# Patient Record
Sex: Female | Born: 1960 | Race: Black or African American | Hispanic: No | Marital: Single | Smoking: Former smoker
Health system: Southern US, Community
[De-identification: ages and names within clinical notes are randomized; demographics above are authoritative.]

## PROBLEM LIST (undated history)

## (undated) DIAGNOSIS — J309 Allergic rhinitis, unspecified: Secondary | ICD-10-CM

## (undated) DIAGNOSIS — Z973 Presence of spectacles and contact lenses: Secondary | ICD-10-CM

## (undated) DIAGNOSIS — N952 Postmenopausal atrophic vaginitis: Secondary | ICD-10-CM

## (undated) DIAGNOSIS — K5900 Constipation, unspecified: Secondary | ICD-10-CM

## (undated) DIAGNOSIS — E559 Vitamin D deficiency, unspecified: Secondary | ICD-10-CM

## (undated) DIAGNOSIS — H81399 Other peripheral vertigo, unspecified ear: Secondary | ICD-10-CM

## (undated) DIAGNOSIS — Z780 Asymptomatic menopausal state: Secondary | ICD-10-CM

## (undated) DIAGNOSIS — J302 Other seasonal allergic rhinitis: Secondary | ICD-10-CM

## (undated) DIAGNOSIS — Z9289 Personal history of other medical treatment: Secondary | ICD-10-CM

## (undated) HISTORY — DX: Other seasonal allergic rhinitis: J30.2

## (undated) HISTORY — PX: APPENDECTOMY: SHX54

## (undated) HISTORY — DX: Constipation, unspecified: K59.00

## (undated) HISTORY — DX: Postmenopausal atrophic vaginitis: N95.2

## (undated) HISTORY — DX: Asymptomatic menopausal state: Z78.0

## (undated) HISTORY — DX: Vitamin D deficiency, unspecified: E55.9

## (undated) HISTORY — DX: Allergic rhinitis, unspecified: J30.9

## (undated) HISTORY — DX: Presence of spectacles and contact lenses: Z97.3

## (undated) HISTORY — PX: EXCISIONAL HEMORRHOIDECTOMY: SHX1541

## (undated) HISTORY — DX: Other peripheral vertigo, unspecified ear: H81.399

## (undated) HISTORY — DX: Personal history of other medical treatment: Z92.89

---

## 1979-03-20 HISTORY — PX: BREAST CYST EXCISION: SHX579

## 1997-10-19 ENCOUNTER — Other Ambulatory Visit: Admission: RE | Admit: 1997-10-19 | Discharge: 1997-10-19 | Payer: Self-pay | Admitting: Obstetrics & Gynecology

## 1999-07-18 ENCOUNTER — Encounter: Payer: Self-pay | Admitting: Family Medicine

## 1999-07-18 ENCOUNTER — Ambulatory Visit (HOSPITAL_COMMUNITY): Admission: RE | Admit: 1999-07-18 | Discharge: 1999-07-18 | Payer: Self-pay | Admitting: Family Medicine

## 1999-12-01 ENCOUNTER — Other Ambulatory Visit: Admission: RE | Admit: 1999-12-01 | Discharge: 1999-12-01 | Payer: Self-pay | Admitting: Family Medicine

## 2001-04-17 ENCOUNTER — Other Ambulatory Visit: Admission: RE | Admit: 2001-04-17 | Discharge: 2001-04-17 | Payer: Self-pay | Admitting: Family Medicine

## 2005-03-19 DIAGNOSIS — Z9289 Personal history of other medical treatment: Secondary | ICD-10-CM

## 2005-03-19 HISTORY — DX: Personal history of other medical treatment: Z92.89

## 2005-04-10 ENCOUNTER — Ambulatory Visit: Payer: Self-pay | Admitting: Gastroenterology

## 2005-04-27 ENCOUNTER — Ambulatory Visit: Payer: Self-pay | Admitting: Gastroenterology

## 2005-05-31 ENCOUNTER — Other Ambulatory Visit: Admission: RE | Admit: 2005-05-31 | Discharge: 2005-05-31 | Payer: Self-pay | Admitting: Family Medicine

## 2005-06-01 ENCOUNTER — Ambulatory Visit (HOSPITAL_COMMUNITY): Admission: RE | Admit: 2005-06-01 | Discharge: 2005-06-01 | Payer: Self-pay | Admitting: Family Medicine

## 2005-06-13 ENCOUNTER — Encounter: Admission: RE | Admit: 2005-06-13 | Discharge: 2005-06-13 | Payer: Self-pay | Admitting: General Surgery

## 2005-09-07 ENCOUNTER — Ambulatory Visit: Payer: Self-pay | Admitting: Family Medicine

## 2006-08-29 ENCOUNTER — Ambulatory Visit: Payer: Self-pay | Admitting: Family Medicine

## 2008-10-20 ENCOUNTER — Ambulatory Visit: Payer: Self-pay | Admitting: Family Medicine

## 2008-10-26 ENCOUNTER — Ambulatory Visit: Payer: Self-pay | Admitting: Family Medicine

## 2010-04-09 ENCOUNTER — Encounter: Payer: Self-pay | Admitting: Family Medicine

## 2011-06-29 ENCOUNTER — Other Ambulatory Visit (HOSPITAL_COMMUNITY): Payer: Self-pay | Admitting: Physician Assistant

## 2011-06-29 ENCOUNTER — Ambulatory Visit (HOSPITAL_COMMUNITY): Payer: Self-pay

## 2011-06-29 DIAGNOSIS — R52 Pain, unspecified: Secondary | ICD-10-CM

## 2011-06-29 DIAGNOSIS — R0602 Shortness of breath: Secondary | ICD-10-CM

## 2011-10-15 ENCOUNTER — Encounter: Payer: Self-pay | Admitting: Medical

## 2011-10-22 ENCOUNTER — Telehealth: Payer: Self-pay | Admitting: Family Medicine

## 2011-10-22 ENCOUNTER — Ambulatory Visit (INDEPENDENT_AMBULATORY_CARE_PROVIDER_SITE_OTHER): Payer: BC Managed Care – PPO | Admitting: Medical

## 2011-10-22 ENCOUNTER — Other Ambulatory Visit (HOSPITAL_COMMUNITY)
Admission: RE | Admit: 2011-10-22 | Discharge: 2011-10-22 | Disposition: A | Discharge status: Home / Self Care | Payer: BC Managed Care – PPO | Source: Ambulatory Visit | Attending: Medical | Admitting: Medical

## 2011-10-22 ENCOUNTER — Encounter: Payer: Self-pay | Admitting: Medical

## 2011-10-22 VITALS — BP 120/80 | HR 68 | Temp 97.9°F | Resp 16 | Ht 59.0 in | Wt 122.0 lb

## 2011-10-22 DIAGNOSIS — Z1239 Encounter for other screening for malignant neoplasm of breast: Secondary | ICD-10-CM

## 2011-10-22 DIAGNOSIS — Z01419 Encounter for gynecological examination (general) (routine) without abnormal findings: Secondary | ICD-10-CM | POA: Insufficient documentation

## 2011-10-22 DIAGNOSIS — Z1151 Encounter for screening for human papillomavirus (HPV): Secondary | ICD-10-CM | POA: Insufficient documentation

## 2011-10-22 DIAGNOSIS — Z124 Encounter for screening for malignant neoplasm of cervix: Secondary | ICD-10-CM

## 2011-10-22 DIAGNOSIS — Z1231 Encounter for screening mammogram for malignant neoplasm of breast: Secondary | ICD-10-CM

## 2011-10-22 DIAGNOSIS — Z0000 Encounter for general adult medical examination without abnormal findings: Secondary | ICD-10-CM

## 2011-10-22 LAB — CBC WITH DIFFERENTIAL/PLATELET
Basophils Absolute: 0.1 10*3/uL (ref 0.0–0.1)
Basophils Relative: 1 % (ref 0–1)
Eosinophils Absolute: 0.3 10*3/uL (ref 0.0–0.7)
Eosinophils Relative: 6 % — ABNORMAL HIGH (ref 0–5)
HCT: 37.9 % (ref 36.0–46.0)
Hemoglobin: 13.1 g/dL (ref 12.0–15.0)
Lymphocytes Relative: 41 % (ref 12–46)
Lymphs Abs: 2.1 10*3/uL (ref 0.7–4.0)
MCH: 30.6 pg (ref 26.0–34.0)
MCHC: 34.6 g/dL (ref 30.0–36.0)
MCV: 88.6 fL (ref 78.0–100.0)
Monocytes Absolute: 0.4 10*3/uL (ref 0.1–1.0)
Monocytes Relative: 8 % (ref 3–12)
Neutro Abs: 2.3 10*3/uL (ref 1.7–7.7)
Neutrophils Relative %: 44 % (ref 43–77)
Platelets: 295 10*3/uL (ref 150–400)
RBC: 4.28 MIL/uL (ref 3.87–5.11)
RDW: 14.2 % (ref 11.5–15.5)
WBC: 5.2 10*3/uL (ref 4.0–10.5)

## 2011-10-22 LAB — POCT URINALYSIS DIPSTICK
Protein, UA: NEGATIVE
Spec Grav, UA: 1.015
Urobilinogen, UA: NEGATIVE
pH, UA: 5

## 2011-10-22 NOTE — Patient Instructions (Signed)

## 2011-10-22 NOTE — Addendum Note (Signed)
Addended by: Janeice Robinson on: 10/22/2011 04:06 PM   Modules accepted: Orders

## 2011-10-22 NOTE — Progress Notes (Signed)
Subjective:   HPI  Dana Li is a 51 y.o. female who presents for a complete physical.  No particular c/o.  Last medical care 2010.   Been doing well.  Wanted to come in for physical.   Preventative care: Last ophthalmology visit: over 1 year ago Last dental visit: over 1 year ago Last tetanus booster: 2009 Flu vaccine: declines  Reviewed their medical, surgical, family, social, medication, and allergy history and updated chart as appropriate.  Past Medical History  Diagnosis Date  . Wears eyeglasses   . H/O mammogram 2007    Past Surgical History  Procedure Date  . Excisional hemorrhoidectomy   . Colonoscopy 04/27/2005    Dr. Christella Hartigan, repeat 2017    Family History  Problem Relation Age of Onset  . Diabetes Mother   . Heart disease Mother 37  . Cancer Mother 31    breast cancer  . Other Father     died of illness  . Asthma Brother     1 died of asthma  . Liver disease Brother   . Stroke Maternal Grandmother     History   Social History  . Marital Status: Married    Spouse Name: N/A    Number of Children: N/A  . Years of Education: N/A   Occupational History  . packaging at [employer]    Social History Main Topics  . Smoking status: Former Smoker -- 0.5 packs/day for 18 years    Types: Cigarettes    Quit date: 03/19/1994  . Smokeless tobacco: Not on file  . Alcohol Use: 0.6 oz/week    1 Glasses of wine per week  . Drug Use: No  . Sexually Active: Not on file   Other Topics Concern  . Not on file   Social History Narrative   Widowed in 03/2008.  No current significant other.  Has 52yo and 52yo, both in [place].  Exercise some.    No current outpatient prescriptions on file prior to visit.    No Known Allergies  Review of Systems Constitutional: -fever, -chills, -sweats, -unexpected weight change, -anorexia, -fatigue Allergy: -sneezing, -itching, -congestion Dermatology: denies changing moles, rash, lumps, new worrisome lesions ENT: -runny  nose, -ear pain, -sore throat, -hoarseness, -sinus pain, -teeth pain, -tinnitus, -hearing loss, -epistaxis Cardiology:  -chest pain, -palpitations, -edema, -orthopnea, -paroxysmal nocturnal dyspnea Respiratory: -cough, -shortness of breath, -dyspnea on exertion, -wheezing, -hemoptysis Gastroenterology: -abdominal pain, -nausea, -vomiting, -diarrhea, -constipation, -blood in stool, -changes in bowel movement, -dysphagia Hematology: -bleeding or bruising problems Musculoskeletal: -arthralgias, -myalgias, -joint swelling, -back pain, -neck pain, -cramping, -gait changes Ophthalmology: -vision changes, -eye redness, -itching, -discharge Urology: -dysuria, -difficulty urinating, -hematuria, -urinary frequency, -urgency, incontinence Neurology: -headache, -weakness, -tingling, -numbness, -speech abnormality, -memory loss, -falls, -dizziness Psychology:  -depressed mood, -agitation, -sleep problems     Objective:   Physical Exam  Filed Vitals:   10/22/11 0918  BP: 120/80  Pulse: 68  Temp: 97.9 F (36.6 C)  Resp: 16    General appearance: alert, no distress, WD/WN, AA female, pleasant Skin: scattered benign appearing macules of arms, face, chest, no worrisome lesions.  Right upper chest with football shaped 4mm x 2mm lesion, flat and brown HEENT: normocephalic, conjunctiva/corneas normal, sclerae anicteric, PERRLA, EOMi, nares patent, no discharge or erythema, pharynx normal Oral cavity: MMM, tongue normal, teeth in good repair Neck: supple, no lymphadenopathy, no thyromegaly, no masses, normal ROM, no bruits Chest: non tender, normal shape and expansion Heart: RRR, normal S1, S2, no murmurs Lungs: CTA  bilaterally, no wheezes, rhonchi, or rales Abdomen: +bs, soft, non tender, non distended, no masses, no hepatomegaly, no splenomegaly, no bruits Back: non tender, normal ROM, no scoliosis Musculoskeletal: upper extremities non tender, no obvious deformity, normal ROM throughout, lower  extremities non tender, no obvious deformity, normal ROM throughout Extremities: no edema, no cyanosis, no clubbing Pulses: 2+ symmetric, upper and lower extremities, normal cap refill Neurological: alert, oriented x 3, CN2-12 intact, strength normal upper extremities and lower extremities, sensation normal throughout, DTRs 2+ throughout, no cerebellar signs, gait normal Psychiatric: normal affect, behavior normal, pleasant  Breast: nontender, no masses or lumps, no skin changes, no nipple discharge or inversion, no axillary lymphadenopathy Gyn: Normal external genitalia without lesions, vagina with normal mucosa, 3mm raised round lesion at 1 oclock of cervix suggestive of a polyp, otherwise cervix without lesions, no cervical motion tenderness, no abnormal vaginal discharge.  Uterus and adnexa not enlarged, nontender, no masses.  Pap performed.  Exam chaperoned by nurse. Rectal: anus normal appearing, no mass, occult negative stool    Assessment and Plan :    Encounter Diagnoses  Name Primary?  . Routine general medical examination at a health care facility Yes  . Screening for cervical cancer   . Screening for breast cancer     Physical exam - discussed healthy lifestyle, diet, exercise, preventative care, vaccinations, and addressed their concerns.  Handout given.  Pap smear sent, we scheduled a mammogram for her.  Colonoscopy repeat not due until 2017.    Follow-up pending labs.

## 2011-10-22 NOTE — Telephone Encounter (Signed)
Patient has a mammogram appointment at [facility] on 11/07/11 @ 815 am. CLS Patient was given all the paper work with all of the appointment information on it. CLS [phone]

## 2011-10-23 LAB — COMPREHENSIVE METABOLIC PANEL
ALT: 16 U/L (ref 0–35)
AST: 27 U/L (ref 0–37)
Albumin: 4.2 g/dL (ref 3.5–5.2)
Alkaline Phosphatase: 88 U/L (ref 39–117)
BUN: 9 mg/dL (ref 6–23)
CO2: 21 mEq/L (ref 19–32)
Calcium: 9.6 mg/dL (ref 8.4–10.5)
Chloride: 106 mEq/L (ref 96–112)
Creat: 0.63 mg/dL (ref 0.50–1.10)
Glucose, Bld: 91 mg/dL (ref 70–99)
Potassium: 4.3 mEq/L (ref 3.5–5.3)
Sodium: 139 mEq/L (ref 135–145)
Total Bilirubin: 0.3 mg/dL (ref 0.3–1.2)
Total Protein: 7.3 g/dL (ref 6.0–8.3)

## 2011-10-23 LAB — LIPID PANEL
Cholesterol: 211 mg/dL — ABNORMAL HIGH (ref 0–200)
HDL: 65 mg/dL (ref >39)
LDL Cholesterol: 121 mg/dL — ABNORMAL HIGH (ref 0–99)
Total CHOL/HDL Ratio: 3.2 Ratio
Triglycerides: 124 mg/dL (ref <150)
VLDL: 25 mg/dL (ref 0–40)

## 2011-10-24 ENCOUNTER — Other Ambulatory Visit: Payer: Self-pay | Admitting: Medical

## 2011-10-24 LAB — URINE CULTURE: Colony Count: NO GROWTH

## 2011-10-24 MED ORDER — ERGOCALCIFEROL 1.25 MG (50000 UT) PO CAPS: 50000.0000 [IU] | ORAL_CAPSULE | ORAL | Status: DC

## 2011-11-07 ENCOUNTER — Ambulatory Visit (HOSPITAL_COMMUNITY): Admission: RE | Admit: 2011-11-07 | Payer: BC Managed Care – PPO | Source: Ambulatory Visit

## 2011-11-22 ENCOUNTER — Ambulatory Visit (HOSPITAL_COMMUNITY)
Admission: RE | Admit: 2011-11-22 | Discharge: 2011-11-22 | Disposition: A | Discharge status: Home / Self Care | Payer: BC Managed Care – PPO | Source: Ambulatory Visit | Attending: Medical | Admitting: Medical

## 2011-11-22 DIAGNOSIS — Z1231 Encounter for screening mammogram for malignant neoplasm of breast: Secondary | ICD-10-CM | POA: Insufficient documentation

## 2012-05-15 ENCOUNTER — Encounter (HOSPITAL_COMMUNITY): Payer: Self-pay

## 2012-05-15 ENCOUNTER — Emergency Department (HOSPITAL_COMMUNITY)
Admission: EM | Admit: 2012-05-15 | Discharge: 2012-05-15 | Disposition: A | Discharge status: Home / Self Care | Payer: BC Managed Care – PPO | Attending: Emergency Medicine | Admitting: Emergency Medicine

## 2012-05-15 DIAGNOSIS — Z87891 Personal history of nicotine dependence: Secondary | ICD-10-CM | POA: Insufficient documentation

## 2012-05-15 DIAGNOSIS — R1110 Vomiting, unspecified: Secondary | ICD-10-CM | POA: Insufficient documentation

## 2012-05-15 DIAGNOSIS — Z8669 Personal history of other diseases of the nervous system and sense organs: Secondary | ICD-10-CM | POA: Insufficient documentation

## 2012-05-15 DIAGNOSIS — H81399 Other peripheral vertigo, unspecified ear: Secondary | ICD-10-CM | POA: Insufficient documentation

## 2012-05-15 LAB — BASIC METABOLIC PANEL
BUN: 10 mg/dL (ref 6–23)
CO2: 22 mEq/L (ref 19–32)
Calcium: 9.5 mg/dL (ref 8.4–10.5)
Chloride: 103 mEq/L (ref 96–112)
Creatinine, Ser: 0.53 mg/dL (ref 0.50–1.10)
GFR calc Af Amer: >90 mL/min (ref >90)
GFR calc non Af Amer: >90 mL/min (ref >90)
Glucose, Bld: 115 mg/dL — ABNORMAL HIGH (ref 70–99)
Potassium: 4.4 mEq/L (ref 3.5–5.1)
Sodium: 137 mEq/L (ref 135–145)

## 2012-05-15 LAB — CBC WITH DIFFERENTIAL/PLATELET
Basophils Absolute: 0 10*3/uL (ref 0.0–0.1)
Basophils Relative: 1 % (ref 0–1)
Eosinophils Absolute: 0.1 10*3/uL (ref 0.0–0.7)
Eosinophils Relative: 1 % (ref 0–5)
HCT: 39.2 % (ref 36.0–46.0)
Hemoglobin: 13.5 g/dL (ref 12.0–15.0)
Lymphocytes Relative: 14 % (ref 12–46)
Lymphs Abs: 1.2 10*3/uL (ref 0.7–4.0)
MCH: 31 pg (ref 26.0–34.0)
MCHC: 34.4 g/dL (ref 30.0–36.0)
MCV: 89.9 fL (ref 78.0–100.0)
Monocytes Absolute: 0.3 10*3/uL (ref 0.1–1.0)
Monocytes Relative: 4 % (ref 3–12)
Neutro Abs: 7.1 10*3/uL (ref 1.7–7.7)
Neutrophils Relative %: 81 % — ABNORMAL HIGH (ref 43–77)
Platelets: 293 10*3/uL (ref 150–400)
RBC: 4.36 MIL/uL (ref 3.87–5.11)
RDW: 13.3 % (ref 11.5–15.5)
WBC: 8.7 10*3/uL (ref 4.0–10.5)

## 2012-05-15 LAB — URINALYSIS, ROUTINE W REFLEX MICROSCOPIC
Bilirubin Urine: NEGATIVE
Glucose, UA: NEGATIVE mg/dL
Hgb urine dipstick: NEGATIVE
Ketones, ur: NEGATIVE mg/dL
Leukocytes, UA: NEGATIVE
Nitrite: NEGATIVE
Protein, ur: NEGATIVE mg/dL
Specific Gravity, Urine: 1.02 (ref 1.005–1.030)
Urobilinogen, UA: 0.2 mg/dL (ref 0.0–1.0)
pH: 8 (ref 5.0–8.0)

## 2012-05-15 MED ORDER — MECLIZINE HCL 25 MG PO TABS
25.0000 mg | ORAL_TABLET | Freq: Once | ORAL | Status: AC
  Administered 2012-05-15: 25 mg via ORAL
  Filled 2012-05-15: qty 1

## 2012-05-15 MED ORDER — MECLIZINE HCL 25 MG PO TABS: 25.0000 mg | ORAL_TABLET | Freq: Three times a day (TID) | ORAL | Status: DC | PRN

## 2012-05-15 MED ORDER — SODIUM CHLORIDE 0.9 % IV BOLUS (SEPSIS)
1000.0000 mL | Freq: Once | INTRAVENOUS | Status: AC
  Administered 2012-05-15: 1000 mL via INTRAVENOUS

## 2012-05-15 MED ORDER — PROMETHAZINE HCL 25 MG PO TABS: 25.0000 mg | ORAL_TABLET | Freq: Four times a day (QID) | ORAL | Status: DC | PRN

## 2012-05-15 NOTE — ED Notes (Signed)
Patient reports that she has vomited x 1 and c/o dizziness when she turns on her left side while lying down.

## 2012-05-15 NOTE — ED Provider Notes (Signed)
History     CSN: [id number]  Arrival date & time 05/15/12  1756   First MD Initiated Contact with Patient 05/15/12 1823      Chief Complaint  Patient presents with  . Emesis  . Dizziness    (Consider location/radiation/quality/duration/timing/severity/associated sxs/prior treatment) HPI Patient is a 52 yo female who presents with dizziness and emesis.  When she got home from work this morning she started to feel a little dizzy but went to bed.  When she woke up she noticed that when she would turn her head to the left side it would induce dizziness and make her feel like the room is spinning around her.  Prior to coming to the ER today, she had one episode of vomiting without blood.  She has had similar episodes of this in the past when she had inner ear infections causing the room to spin around her.  Denies any shortness of breath, tinnitus, chest pain, diarrhea, constipation, fever, congestion, sore through or cough.    Past Medical History  Diagnosis Date  . Wears eyeglasses   . H/O mammogram 2007    Past Surgical History  Procedure Laterality Date  . Excisional hemorrhoidectomy    . Colonoscopy  04/27/2005    Dr. Christella Hartigan, repeat 2017    Family History  Problem Relation Age of Onset  . Diabetes Mother   . Heart disease Mother 49  . Cancer Mother 83    breast cancer  . Other Father     died of illness  . Asthma Brother     1 died of asthma  . Liver disease Brother   . Stroke Maternal Grandmother     History  Substance Use Topics  . Smoking status: Former Smoker -- 0.50 packs/day for 18 years    Types: Cigarettes    Quit date: 03/19/1994  . Smokeless tobacco: Never Used  . Alcohol Use: 0.6 oz/week    1 Glasses of wine per week     Comment: occasionally    OB History   Grav Para Term Preterm Abortions TAB SAB Ect Mult Living                  Review of Systems All other systems negative except as documented in the HPI. All pertinent positives and negatives  as reviewed in the HPI.  Allergies  Review of patient's allergies indicates no known allergies.  Home Medications   Current Outpatient Rx  Name  Route  Sig  Dispense  Refill  . diphenhydrAMINE (BENADRYL) 25 MG tablet   Oral   Take 25 mg by mouth every 6 (six) hours as needed for itching. Sleep           BP 124/78  Pulse 67  Temp(Src) 97.7 F (36.5 C) (Oral)  Resp 20  SpO2 98%  Physical Exam  Nursing note and vitals reviewed. Constitutional: She is oriented to person, place, and time. She appears well-developed and well-nourished. No distress.  HENT:  Head: Normocephalic and atraumatic.  Right Ear: Tympanic membrane, external ear and ear canal normal.  Left Ear: Tympanic membrane, external ear and ear canal normal.  Mouth/Throat: Uvula is midline, oropharynx is clear and moist and mucous membranes are normal. No oropharyngeal exudate.  Eyes: Pupils are equal, round, and reactive to light. Right eye exhibits no discharge. Left eye exhibits no discharge. No scleral icterus.  Neck: Normal range of motion. Neck supple.  Cardiovascular: Normal rate, regular rhythm and normal heart sounds.  Exam  reveals no gallop and no friction rub.   No murmur heard. Pulmonary/Chest: Effort normal and breath sounds normal. No respiratory distress. She has no wheezes. She has no rales.  Abdominal: Soft. Bowel sounds are normal. There is no tenderness.  Neurological: She is alert and oriented to person, place, and time.  Skin: Skin is warm and dry. No rash noted. She is not diaphoretic. No erythema. No pallor.  Psychiatric: She has a normal mood and affect. Her behavior is normal. Judgment and thought content normal.    ED Course  Procedures (including critical care time)   7:25pm patient states that she feels less dizzy after receiving the zofran   The patient is feeling improved. The patient most likely has peripheral vertigo based on her PE and HPI. The condition is positional and there  are not fine motor deficits. The patient is advised to return here as needed. Follow up with her PCP.  MDM  MDM Reviewed: vitals and nursing note Interpretation: labs            Carlyle Dolly, PA-C 05/15/12 2015

## 2012-05-17 NOTE — ED Provider Notes (Signed)
Medical screening examination/treatment/procedure(s) were performed by non-physician practitioner and as supervising physician I was immediately available for consultation/collaboration.  Kaedyn Belardo, MD 05/17/12 0719 

## 2012-08-15 ENCOUNTER — Ambulatory Visit: Payer: Self-pay | Admitting: Medical

## 2014-01-14 ENCOUNTER — Encounter: Payer: Self-pay | Admitting: Medical

## 2014-01-14 ENCOUNTER — Ambulatory Visit (INDEPENDENT_AMBULATORY_CARE_PROVIDER_SITE_OTHER): Payer: BC Managed Care – PPO | Admitting: Medical

## 2014-01-14 ENCOUNTER — Telehealth: Payer: Self-pay | Admitting: Medical

## 2014-01-14 VITALS — BP 126/80 | HR 78 | Temp 97.8°F | Resp 15 | Ht 59.5 in | Wt 127.0 lb

## 2014-01-14 DIAGNOSIS — Z0000 Encounter for general adult medical examination without abnormal findings: Secondary | ICD-10-CM

## 2014-01-14 DIAGNOSIS — N951 Menopausal and female climacteric states: Secondary | ICD-10-CM | POA: Insufficient documentation

## 2014-01-14 DIAGNOSIS — Z124 Encounter for screening for malignant neoplasm of cervix: Secondary | ICD-10-CM

## 2014-01-14 DIAGNOSIS — Z1239 Encounter for other screening for malignant neoplasm of breast: Secondary | ICD-10-CM

## 2014-01-14 DIAGNOSIS — E559 Vitamin D deficiency, unspecified: Secondary | ICD-10-CM

## 2014-01-14 DIAGNOSIS — Z2821 Immunization not carried out because of patient refusal: Secondary | ICD-10-CM

## 2014-01-14 DIAGNOSIS — N952 Postmenopausal atrophic vaginitis: Secondary | ICD-10-CM

## 2014-01-14 DIAGNOSIS — H81399 Other peripheral vertigo, unspecified ear: Secondary | ICD-10-CM

## 2014-01-14 DIAGNOSIS — K5900 Constipation, unspecified: Secondary | ICD-10-CM | POA: Insufficient documentation

## 2014-01-14 LAB — COMPREHENSIVE METABOLIC PANEL
ALBUMIN: 4.6 g/dL (ref 3.5–5.2)
ALK PHOS: 90 U/L (ref 39–117)
ALT: 10 U/L (ref 0–35)
AST: 19 U/L (ref 0–37)
BILIRUBIN TOTAL: 0.4 mg/dL (ref 0.2–1.2)
BUN: 19 mg/dL (ref 6–23)
CO2: 22 mEq/L (ref 19–32)
Calcium: 9.5 mg/dL (ref 8.4–10.5)
Chloride: 104 mEq/L (ref 96–112)
Creat: 0.68 mg/dL (ref 0.50–1.10)
GLUCOSE: 99 mg/dL (ref 70–99)
POTASSIUM: 4.2 meq/L (ref 3.5–5.3)
SODIUM: 137 meq/L (ref 135–145)
Total Protein: 7.4 g/dL (ref 6.0–8.3)

## 2014-01-14 LAB — POCT URINALYSIS DIPSTICK
Bilirubin, UA: NEGATIVE
Glucose, UA: NEGATIVE
Ketones, UA: NEGATIVE
LEUKOCYTES UA: NEGATIVE
NITRITE UA: NEGATIVE
PH UA: 5.5
PROTEIN UA: NEGATIVE
RBC UA: NEGATIVE
Spec Grav, UA: 1.015
Urobilinogen, UA: NEGATIVE

## 2014-01-14 LAB — LIPID PANEL
Cholesterol: 245 mg/dL — ABNORMAL HIGH (ref 0–200)
HDL: 71 mg/dL (ref >39)
LDL CALC: 153 mg/dL — AB (ref 0–99)
TRIGLYCERIDES: 103 mg/dL (ref <150)
Total CHOL/HDL Ratio: 3.5 Ratio
VLDL: 21 mg/dL (ref 0–40)

## 2014-01-14 LAB — CBC
HEMATOCRIT: 39.8 % (ref 36.0–46.0)
HEMOGLOBIN: 13.8 g/dL (ref 12.0–15.0)
MCH: 30.4 pg (ref 26.0–34.0)
MCHC: 34.7 g/dL (ref 30.0–36.0)
MCV: 87.7 fL (ref 78.0–100.0)
Platelets: 324 10*3/uL (ref 150–400)
RBC: 4.54 MIL/uL (ref 3.87–5.11)
RDW: 14.6 % (ref 11.5–15.5)
WBC: 6.7 10*3/uL (ref 4.0–10.5)

## 2014-01-14 MED ORDER — MECLIZINE HCL 25 MG PO TABS: 25.0000 mg | ORAL_TABLET | Freq: Three times a day (TID) | ORAL | Status: DC | PRN

## 2014-01-14 NOTE — Progress Notes (Signed)
Subjective:   HPI  Dana Li is a 53 y.o. female who presents for a complete physical.  Doing well in general.  Last physical here 2013.   Has biometric form to complete for work.   Preventative care: Last ophthalmology visit: 2015 [facility] Last dental visit:years ago no dentist Last colonoscopy:2009 Last mammogram:2013 Last gynecological ZCHY:8502 Last DXA:JOIN here last Last labs:fasting today  Prior vaccinations: TD or Tdap:2013? Influenza:no declines Pneumococcal:no Shingles/Zostavax:no Other: no  Advanced directive:no Health care power of attorney:no Living will:no  Concerns: Dizziness - has used meclizine in the past, would like refill on this.  Occasional.  Position, worse getting up from lying position.    Allergic rhinitis - uses benadryl OTC.  Constipation - has BMs 2-3 x per week, has to strain.  Uses Corectol OTC.   Doesn't drink water, mainly soda, coffee and tea.   No prior prescription meds.   Menopause -discussed treatment options in 2013 with gynecology.  Not taking anything.  No c/o today.  Atrophic vaginitis - addressed with gynecology in 2013, estrogen cream recommended.   No c/o.  Vit D deficiency - not taking vit D.   Reviewed their medical, surgical, family, social, medication, and allergy history and updated chart as appropriate.  Past Medical History  Diagnosis Date  . Wears eyeglasses   . H/O mammogram 2007  . Constipation   . Vertigo, peripheral     occasional  . Atrophic vaginitis   . Menopause   . Vitamin D deficiency     Past Surgical History  Procedure Laterality Date  . Excisional hemorrhoidectomy    . Colonoscopy  04/27/2005    Dr. Ardis Hughs, repeat 2017  . Appendectomy      History   Social History  . Marital Status: Married    Spouse Name: N/A    Number of Children: N/A  . Years of Education: N/A   Occupational History  . 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 Smoking status: Former Smoker -- 0.50  packs/day for 18 years    Types: Cigarettes    Quit date: 03/19/1994  . Smokeless tobacco: Never Used  . Alcohol Use: 0.6 oz/week    1 Glasses of wine per week     Comment: occasionally  . Drug Use: No  . Sexual Activity: No   Other Topics Concern  . Not on file   Social History Narrative   Widowed in 03/2008.  No current significant other.  Has 39yo and 31yo children, both in [place].  Exercises some.    Family History  Problem Relation Age of Onset  . Diabetes Mother   . Heart disease Mother 13  . Cancer Mother 73    breast cancer  . Other Father     died of illness  . Asthma Brother     1 died of asthma  . Liver disease Brother   . Stroke Maternal Grandmother     Current outpatient prescriptions:Multiple Vitamins-Minerals (MULTIVITAMIN WITH MINERALS) tablet, Take 1 tablet by mouth daily., Disp: , Rfl: ;  diphenhydrAMINE (BENADRYL) 25 MG tablet, Take 25 mg by mouth every 6 (six) hours as needed for itching. Sleep, Disp: , Rfl: ;  meclizine (ANTIVERT) 25 MG tablet, Take 1 tablet (25 mg total) by mouth 3 (three) times daily as needed., Disp: 21 tablet, Rfl: 0  No Known Allergies   Review of Systems Constitutional: -fever, -chills, +sweats, -unexpected weight change, -decreased appetite, -fatigue Allergy: +sneezing, -itching, -congestion Dermatology: -  changing moles, --rash, -lumps ENT: -runny nose, -ear pain, -sore throat, -hoarseness, -sinus pain, -teeth pain, - ringing in ears, -hearing loss, -nosebleeds Cardiology: -chest pain, -palpitations, -swelling, -difficulty breathing when lying flat, -waking up short of breath Respiratory: -cough, -shortness of breath, -difficulty breathing with exercise or exertion, -wheezing, -coughing up blood Gastroenterology: -abdominal pain, -nausea, -vomiting, -diarrhea, +constipation, -blood in stool, -changes in bowel movement, -difficulty swallowing or eating Hematology: -bleeding, -bruising  Musculoskeletal: -joint aches, -muscle  aches, -joint swelling, -back pain, -neck pain, -cramping, -changes in gait Ophthalmology: denies vision changes, eye redness, itching, discharge Urology: -burning with urination, -difficulty urinating, -blood in urine, -urinary frequency, -urgency, -incontinence Neurology: -headache, -weakness, -tingling, -numbness, -memory loss, -falls, +dizziness Psychology: -depressed mood, -agitation, -sleep problems     Objective:   Physical Exam  BP 126/80  Pulse 78  Temp(Src) 97.8 F (36.6 C) (Oral)  Resp 15  Ht 4' 11.5" (1.511 m)  Wt 127 lb (57.607 kg)  BMI 25.23 kg/m2   General appearance: alert, no distress, WD/WN, AA female, pleasant  Skin: scattered benign appearing macules of arms, face, chest, no worrisome lesions. Right upper chest with football shaped 52mm x 63mm lesion, flat and brown, generalized freckles HEENT: normocephalic, conjunctiva/corneas normal, sclerae anicteric, PERRLA, EOMi, nares patent, no discharge or erythema, pharynx normal  Oral cavity: MMM, tongue normal, teeth in good repair  Neck: supple, no lymphadenopathy, no thyromegaly, no masses, normal ROM, no bruits  Chest: non tender, normal shape and expansion  Heart: RRR, normal S1, S2, no murmurs  Lungs: CTA bilaterally, no wheezes, rhonchi, or rales  Abdomen: +bs, soft, midline vertical surgical scar inferior to umbilicus, non tender, non distended, no masses, no hepatomegaly, no splenomegaly, no bruits  Back: non tender, normal ROM, no scoliosis  Musculoskeletal: upper extremities non tender, no obvious deformity, normal ROM throughout, lower extremities non tender, no obvious deformity, normal ROM throughout  Extremities: no edema, no cyanosis, no clubbing  Pulses: 2+ symmetric, upper and lower extremities, normal cap refill  Neurological: alert, oriented x 3, CN2-12 intact, strength normal upper extremities and lower extremities, sensation normal throughout, DTRs 2+ throughout, no cerebellar signs, gait normal   Psychiatric: normal affect, behavior normal, pleasant  Breast: nontender, no masses or lumps, no skin changes, no nipple discharge or inversion, no axillary lymphadenopathy  Gyn: Normal external genitalia without lesions, vagina with normal mucosa, cervix without lesions, no cervical motion tenderness, no abnormal vaginal discharge. Uterus and adnexa not enlarged, nontender, no masses. Exam chaperoned by nurse.  Rectal: anus normal appearing, no mass, occult negative stool    Assessment and Plan :    Encounter Diagnoses  Name Primary?  . Annual physical exam Yes  . Vertigo, peripheral, unspecified laterality   . Constipation, unspecified constipation type   . Menopausal state   . Atrophic vaginitis   . Vitamin D deficiency   . Screening for breast cancer   . Screening for cervical cancer   . Refused influenza vaccine       Physical exam - discussed healthy lifestyle, diet, exercise, preventative care, vaccinations, and addressed their concerns.  Handout given. Vertigo - advised better water intake, can c/t Meclizine prn Constipation - discussed dietary measures, referral to [facility] constipation study Menopause - discussed symptoms, treatment options, risks/benefits of medications, she can consider OTC [place]osh Atrophic vaginitis - discussed findings, possible complications, treatment options.  She declines medication at this time. Vit D deficiency - advised OTC Vit D 1000 IU daily.  1200mg  Ca daily.  Referred for screening mammogram Examination today.  reviewed pap with -HPV DNA from 2013.  reviewed gynecology notes from 2013. Refused influenza vaccine today.   Up to date on tetanus vaccine.  Follow-up pending labs

## 2014-01-14 NOTE — Patient Instructions (Addendum)
Thank you for giving me the opportunity to serve you today.    Your diagnosis today includes: Encounter Diagnoses  Name Primary?  . Annual physical exam Yes  . Vertigo, peripheral, unspecified laterality   . Constipation, unspecified constipation type   . Menopausal state   . Atrophic vaginitis   . Vitamin D deficiency   . Screening for breast cancer   . Screening for cervical cancer   . Refused influenza vaccine      Specific recommendations today include: See your eye doctor yearly for routine vision care. See your dentist yearly for routine dental care including hygiene visits twice yearly. We will schedule your mammogram We will call with lab results Use Vitamin D 1000 IU daily OTC Get 1200mg  of calcium daily through diet and /or supplements For dizziness, continue meclizine as needed For constipation, get 25g of fiber in the diet daily, get 64+oz of water daily in the diet, and we will refer you to drug study for constipation medication. Menopause - consider OTC Black Cohosh/Estrofem.   Return pending labs.    I have included other useful information below for your review.  Menopause Menopause is the normal time of life when menstrual periods stop completely. Menopause is complete when you have missed 12 consecutive menstrual periods. It usually occurs between the ages of 30 years and 41 years. Very rarely does a woman develop menopause before the age of 74 years. At menopause, your ovaries stop producing the female hormones estrogen and progesterone. This can cause undesirable symptoms and also affect your health. Sometimes the symptoms may occur 4-5 years before the menopause begins. There is no relationship between menopause and:  Oral contraceptives.  Number of children you had.  Race.  The age your menstrual periods started (menarche). Heavy smokers and very thin women may develop menopause earlier in life. CAUSES  The ovaries stop producing the female  hormones estrogen and progesterone.  Other causes include:  Surgery to remove both ovaries.  The ovaries stop functioning for no known reason.  Tumors of the pituitary gland in the brain.  Medical disease that affects the ovaries and hormone production.  Radiation treatment to the abdomen or pelvis.  Chemotherapy that affects the ovaries. SYMPTOMS   Hot flashes.  Night sweats.  Decrease in sex drive.  Vaginal dryness and thinning of the vagina causing painful intercourse.  Dryness of the skin and developing wrinkles.  Headaches.  Tiredness.  Irritability.  Memory problems.  Weight gain.  Bladder infections.  Hair growth of the face and chest.  Infertility. More serious symptoms include:  Loss of bone (osteoporosis) causing breaks (fractures).  Depression.  Hardening and narrowing of the arteries (atherosclerosis) causing heart attacks and strokes. DIAGNOSIS   When the menstrual periods have stopped for 12 straight months.  Physical exam.  Hormone studies of the blood. TREATMENT  There are many treatment choices and nearly as many questions about them. The decisions to treat or not to treat menopausal changes is an individual choice made with your health care provider. Your health care provider can discuss the treatments with you. Together, you can decide which treatment will work best for you. Your treatment choices may include:   Hormone therapy (estrogen and progesterone).  Non-hormonal medicines.  Treating the individual symptoms with medicine (for example antidepressants for depression).  Herbal medicines that may help specific symptoms.  Counseling by a psychiatrist or psychologist.  Group therapy.  Lifestyle changes including:  Eating healthy.  Regular exercise.  Limiting  caffeine and alcohol.  Stress management and meditation.  No treatment. HOME CARE INSTRUCTIONS   Take the medicine your health care provider gives you as  directed.  Get plenty of sleep and rest.  Exercise regularly.  Eat a diet that contains calcium (good for the bones) and soy products (acts like estrogen hormone).  Avoid alcoholic beverages.  Do not smoke.  If you have hot flashes, dress in layers.  Take supplements, calcium, and vitamin D to strengthen bones.  You can use over-the-counter lubricants or moisturizers for vaginal dryness.  Group therapy is sometimes very helpful.  Acupuncture may be helpful in some cases. SEEK MEDICAL CARE IF:   You are not sure you are in menopause.  You are having menopausal symptoms and need advice and treatment.  You are still having menstrual periods after age 28 years.  You have pain with intercourse.  Menopause is complete (no menstrual period for 12 months) and you develop vaginal bleeding.  You need a referral to a specialist (gynecologist, psychiatrist, or psychologist) for treatment. SEEK IMMEDIATE MEDICAL CARE IF:   You have severe depression.  You have excessive vaginal bleeding.  You fell and think you have a broken bone.  You have pain when you urinate.  You develop leg or chest pain.  You have a fast pounding heart beat (palpitations).  You have severe headaches.  You develop vision problems.  You feel a lump in your breast.  You have abdominal pain or severe indigestion. Document Released: 05/26/2003 Document Revised: 11/05/2012 Document Reviewed: 10/02/2012 [facility] Patient Information 2015 [place], [place]. This information is not intended to replace advice given to you by your health care provider. Make sure you discuss any questions you have with your health care provider.  Constipation Constipation is when a person has fewer than three bowel movements a week, has difficulty having a bowel movement, or has stools that are dry, hard, or larger than normal. As people grow older, constipation is more common. If you try to fix constipation with medicines that  make you have a bowel movement (laxatives), the problem may get worse. Long-term laxative use may cause the muscles of the colon to become weak. A low-fiber diet, not taking in enough fluids, and taking certain medicines may make constipation worse.  CAUSES   Certain medicines, such as antidepressants, pain medicine, iron supplements, antacids, and water pills.   Certain diseases, such as diabetes, irritable bowel syndrome (IBS), thyroid disease, or depression.   Not drinking enough water.   Not eating enough fiber-rich foods.   Stress or travel.   Lack of physical activity or exercise.   Ignoring the urge to have a bowel movement.   Using laxatives too much.  SIGNS AND SYMPTOMS   Having fewer than three bowel movements a week.   Straining to have a bowel movement.   Having stools that are hard, dry, or larger than normal.   Feeling full or bloated.   Pain in the lower abdomen.   Not feeling relief after having a bowel movement.  DIAGNOSIS  Your health care provider will take a medical history and perform a physical exam. Further testing may be done for severe constipation. Some tests may include:  A barium enema X-ray to examine your rectum, colon, and, sometimes, your small intestine.   A sigmoidoscopy to examine your lower colon.   A colonoscopy to examine your entire colon. TREATMENT  Treatment will depend on the severity of your constipation and what is causing it.  Some dietary treatments include drinking more fluids and eating more fiber-rich foods. Lifestyle treatments may include regular exercise. If these diet and lifestyle recommendations do not help, your health care provider may recommend taking over-the-counter laxative medicines to help you have bowel movements. Prescription medicines may be prescribed if over-the-counter medicines do not work.  HOME CARE INSTRUCTIONS   Eat foods that have a lot of fiber, such as fruits, vegetables, whole  grains, and beans.  Limit foods high in fat and processed sugars, such as french fries, hamburgers, cookies, candies, and soda.   A fiber supplement may be added to your diet if you cannot get enough fiber from foods.   Drink enough fluids to keep your urine clear or pale yellow.   Exercise regularly or as directed by your health care provider.   Go to the restroom when you have the urge to go. Do not hold it.   Only take over-the-counter or prescription medicines as directed by your health care provider. Do not take other medicines for constipation without talking to your health care provider first.  [place] IF:   You have bright red blood in your stool.   Your constipation lasts for more than 4 days or gets worse.   You have abdominal or rectal pain.   You have thin, pencil-like stools.   You have unexplained weight loss. MAKE SURE YOU:   Understand these instructions.  Will watch your condition.  Will get help right away if you are not doing well or get worse. Document Released: 12/02/2003 Document Revised: 03/10/2013 Document Reviewed: 12/15/2012 [facility] Patient Information 2015 [place], [place]. This information is not intended to replace advice given to you by your health care provider. Make sure you discuss any questions you have with your health care provider.   Dentist recommendations: Dr. Jonna Coup, dentist [address] [place], [place] [address] [phone] [email or web address]

## 2014-01-14 NOTE — Telephone Encounter (Signed)
Refer to [employer] for [employer] constipation study

## 2014-01-14 NOTE — Telephone Encounter (Signed)
Faxed form to [employer].

## 2014-01-15 LAB — TSH: TSH: 1.335 u[IU]/mL (ref 0.350–4.500)

## 2014-10-06 ENCOUNTER — Ambulatory Visit (INDEPENDENT_AMBULATORY_CARE_PROVIDER_SITE_OTHER): Payer: BLUE CROSS/BLUE SHIELD | Admitting: Medical

## 2014-10-06 ENCOUNTER — Telehealth: Payer: Self-pay | Admitting: Medical

## 2014-10-06 ENCOUNTER — Encounter: Payer: Self-pay | Admitting: Medical

## 2014-10-06 VITALS — BP 130/80 | HR 64 | Temp 97.6°F | Wt 133.0 lb

## 2014-10-06 DIAGNOSIS — L989 Disorder of the skin and subcutaneous tissue, unspecified: Secondary | ICD-10-CM | POA: Diagnosis not present

## 2014-10-06 NOTE — Progress Notes (Signed)
Subjective: Has mole on right lower leg medial been there a long time, maybe all her life. In the last few months getting change in coloration, becoming more round.  Doesn't think any of her other skin lesions are changing.  No fever, no weight loss, no new symptoms.  Doing fine in general. No other aggravating or relieving factors. No other complaint.  ROS as in subjective  Objective: BP 130/80 mmHg  Pulse 64  Temp(Src) 97.6 F (36.4 C) (Oral)  Wt 133 lb (60.328 kg)  Gen: wd, wn, nad Skin:  1 - right medial leg mid shaft with 46mm roundish brown lesion with somewhat clearing center, with patchy border, but not too unusual 2- right low back with 16mm slightly raised brown/black lesion 3 - Right upper chest 9mm x 43mm football shaped lesion somewhat patchy border, unchanged per patient 4 - numerous flat brown patchy 1-64mm diameter lesions of face, nose, cheeks and forehead unchanged per patient 5- several other benign appearing lesions throughout    Assessment: Encounter Diagnosis  Name Primary?  . Changing skin lesion Yes    Plan: After discussion skin findings, lesions, referral to dermatology makes the most sense for eval and overall skin surveillance

## 2014-10-06 NOTE — Telephone Encounter (Signed)
Referral to [facility] for changing skin lesion and skin surveillance in general

## 2014-10-07 NOTE — Telephone Encounter (Signed)
LM to CB re appt info

## 2014-10-07 NOTE — Telephone Encounter (Signed)
Scheduled derm appointment Aug 19th 3:00 be there 2:45, Dr Eula Listen office with Magnus Sinning PA, records faxed.

## 2014-10-11 NOTE — Telephone Encounter (Signed)
Patient is aware of her appointment and all the details

## 2016-04-18 ENCOUNTER — Ambulatory Visit (INDEPENDENT_AMBULATORY_CARE_PROVIDER_SITE_OTHER): Payer: BLUE CROSS/BLUE SHIELD | Admitting: Medical

## 2016-04-18 ENCOUNTER — Encounter: Payer: Self-pay | Admitting: Medical

## 2016-04-18 VITALS — Wt 139.2 lb

## 2016-04-18 DIAGNOSIS — R030 Elevated blood-pressure reading, without diagnosis of hypertension: Secondary | ICD-10-CM

## 2016-04-18 DIAGNOSIS — H81399 Other peripheral vertigo, unspecified ear: Secondary | ICD-10-CM | POA: Diagnosis not present

## 2016-04-18 MED ORDER — MECLIZINE HCL 25 MG PO TABS: 25.0000 mg | ORAL_TABLET | Freq: Three times a day (TID) | ORAL | 0 refills | Status: AC | PRN

## 2016-04-18 NOTE — Progress Notes (Signed)
Subjective: Chief Complaint  Patient presents with  . vetigo    vetigo , she is outh of her meds for this    Here for vertigo.   I have seen her for this in the past.  Her last visit was over 1.5 years ago.  She notes dizziness, room spinning sensation, can feel it in the bed or when she gets out of the bed.  Denies chest pain, palpitations, edema.  No numbness, no tingling, no weakness, no hearing changes.   Has had some vision changes, hasn't seen eye doctor in a while.  Since last visit she had to move with mother, taking care of her. Is her primary caregiver as she has dementia.  Has been neglecting her health taking care of mother.     Of note, no periods since 2011.    Having vertigo 2-3 times per week.   Past Medical History:  Diagnosis Date  . Allergic rhinitis   . Atrophic vaginitis   . Constipation   . H/O mammogram 2007  . Menopause   . Vertigo, peripheral    occasional  . Vitamin D deficiency   . Wears eyeglasses    Current Outpatient Prescriptions on File Prior to Visit  Medication Sig Dispense Refill  . diphenhydrAMINE (BENADRYL) 25 MG tablet Take 25 mg by mouth every 6 (six) hours as needed for itching. Sleep    . Multiple Vitamins-Minerals (MULTIVITAMIN WITH MINERALS) tablet Take 1 tablet by mouth daily.     No current facility-administered medications on file prior to visit.    ROS as in subjective   Objective: Wt 139 lb 3.2 oz (63.1 kg)   SpO2 99%   BMI 27.64 kg/m   General appearance: alert, no distress, WD/WN,  HEENT: normocephalic, sclerae anicteric, PERRLA, EOMi, nares patent, no discharge or erythema, pharynx normal Oral cavity: MMM, no lesions Neck: supple, no lymphadenopathy, no thyromegaly, no masses, no bruits Heart: RRR, normal S1, S2, no murmurs Lungs: CTA bilaterally, no wheezes, rhonchi, or rales Extremities: no edema, no cyanosis, no clubbing Pulses: 2+ symmetric, upper and lower extremities, normal cap refill Neurological: alert,  oriented x 3, CN2-12 intact, strength normal upper extremities and lower extremities, sensation normal throughout, DTRs 2+ throughout, no cerebellar signs, gait normal Psychiatric: normal affect, behavior normal, pleasant     Assessment: Encounter Diagnoses  Name Primary?  . Peripheral vertigo, unspecified laterality Yes  . Elevated blood-pressure reading without diagnosis of hypertension     Plan: Discussed symptoms, possible causes, but BPPV seems to be culprit.  Begin meclizine, avoid driving or operating machinery if too dizzy.  Referral to PT for vestibular rehab.  Return soon for physical, labs, recheck on BP, but if not improving in the next week or so with vertigo, then recheck.    Dana Li was seen today for vetigo.  Diagnoses and all orders for this visit:  Peripheral vertigo, unspecified laterality -     Orthostatic vital signs  Elevated blood-pressure reading without diagnosis of hypertension  Other orders -     meclizine (ANTIVERT) 25 MG tablet; Take 1 tablet (25 mg total) by mouth 3 (three) times daily as needed.

## 2016-04-24 ENCOUNTER — Ambulatory Visit (INDEPENDENT_AMBULATORY_CARE_PROVIDER_SITE_OTHER): Payer: BLUE CROSS/BLUE SHIELD | Admitting: Medical

## 2016-04-24 ENCOUNTER — Other Ambulatory Visit (HOSPITAL_COMMUNITY)
Admission: RE | Admit: 2016-04-24 | Discharge: 2016-04-24 | Disposition: A | Discharge status: Home / Self Care | Payer: BLUE CROSS/BLUE SHIELD | Source: Ambulatory Visit | Attending: Family Medicine | Admitting: Family Medicine

## 2016-04-24 ENCOUNTER — Encounter: Payer: Self-pay | Admitting: Medical

## 2016-04-24 VITALS — BP 112/70 | HR 62 | Ht <= 58 in | Wt 137.0 lb

## 2016-04-24 DIAGNOSIS — B351 Tinea unguium: Secondary | ICD-10-CM | POA: Diagnosis not present

## 2016-04-24 DIAGNOSIS — R030 Elevated blood-pressure reading, without diagnosis of hypertension: Secondary | ICD-10-CM

## 2016-04-24 DIAGNOSIS — Z124 Encounter for screening for malignant neoplasm of cervix: Secondary | ICD-10-CM

## 2016-04-24 DIAGNOSIS — E559 Vitamin D deficiency, unspecified: Secondary | ICD-10-CM | POA: Diagnosis not present

## 2016-04-24 DIAGNOSIS — N952 Postmenopausal atrophic vaginitis: Secondary | ICD-10-CM | POA: Diagnosis not present

## 2016-04-24 DIAGNOSIS — B353 Tinea pedis: Secondary | ICD-10-CM

## 2016-04-24 DIAGNOSIS — Z0000 Encounter for general adult medical examination without abnormal findings: Secondary | ICD-10-CM

## 2016-04-24 DIAGNOSIS — Z23 Encounter for immunization: Secondary | ICD-10-CM | POA: Diagnosis not present

## 2016-04-24 DIAGNOSIS — Z1231 Encounter for screening mammogram for malignant neoplasm of breast: Secondary | ICD-10-CM | POA: Diagnosis not present

## 2016-04-24 DIAGNOSIS — H81399 Other peripheral vertigo, unspecified ear: Secondary | ICD-10-CM | POA: Diagnosis not present

## 2016-04-24 DIAGNOSIS — Z01419 Encounter for gynecological examination (general) (routine) without abnormal findings: Secondary | ICD-10-CM | POA: Diagnosis not present

## 2016-04-24 DIAGNOSIS — Z1211 Encounter for screening for malignant neoplasm of colon: Secondary | ICD-10-CM

## 2016-04-24 DIAGNOSIS — N951 Menopausal and female climacteric states: Secondary | ICD-10-CM | POA: Diagnosis not present

## 2016-04-24 DIAGNOSIS — Z1239 Encounter for other screening for malignant neoplasm of breast: Secondary | ICD-10-CM | POA: Insufficient documentation

## 2016-04-24 LAB — LIPID PANEL
CHOL/HDL RATIO: 2.9 ratio (ref <5.0)
Cholesterol: 194 mg/dL (ref <200)
HDL: 68 mg/dL (ref >50)
LDL CALC: 113 mg/dL — AB (ref <100)
TRIGLYCERIDES: 67 mg/dL (ref <150)
VLDL: 13 mg/dL (ref <30)

## 2016-04-24 LAB — POCT URINALYSIS DIPSTICK
BILIRUBIN UA: NEGATIVE
GLUCOSE UA: NEGATIVE
KETONES UA: NEGATIVE
Nitrite, UA: NEGATIVE
Protein, UA: NEGATIVE
Urobilinogen, UA: NEGATIVE
pH, UA: 5

## 2016-04-24 LAB — CBC
HEMATOCRIT: 39.1 % (ref 35.0–45.0)
HEMOGLOBIN: 12.9 g/dL (ref 11.7–15.5)
MCH: 30.1 pg (ref 27.0–33.0)
MCHC: 33 g/dL (ref 32.0–36.0)
MCV: 91.1 fL (ref 80.0–100.0)
MPV: 10.7 fL (ref 7.5–12.5)
Platelets: 292 10*3/uL (ref 140–400)
RBC: 4.29 MIL/uL (ref 3.80–5.10)
RDW: 13.8 % (ref 11.0–15.0)
WBC: 5.4 10*3/uL (ref 4.0–10.5)

## 2016-04-24 LAB — COMPREHENSIVE METABOLIC PANEL
ALBUMIN: 4.2 g/dL (ref 3.6–5.1)
ALT: 8 U/L (ref 6–29)
AST: 17 U/L (ref 10–35)
Alkaline Phosphatase: 76 U/L (ref 33–130)
BILIRUBIN TOTAL: 0.7 mg/dL (ref 0.2–1.2)
BUN: 14 mg/dL (ref 7–25)
CALCIUM: 9.3 mg/dL (ref 8.6–10.4)
CO2: 23 mmol/L (ref 20–31)
Chloride: 110 mmol/L (ref 98–110)
Creat: 0.67 mg/dL (ref 0.50–1.05)
Glucose, Bld: 94 mg/dL (ref 65–99)
POTASSIUM: 3.9 mmol/L (ref 3.5–5.3)
Sodium: 141 mmol/L (ref 135–146)
Total Protein: 7 g/dL (ref 6.1–8.1)

## 2016-04-24 LAB — TSH: TSH: 0.59 m[IU]/L

## 2016-04-24 NOTE — Patient Instructions (Addendum)
Recommendations:  See your eye doctor yearly for routine vision care.  See your dentist yearly for routine dental care including hygiene visits twice yearly.  Scheduled your mammogram  We are referring you back for updating colonoscopy  We will call with lab results  Exercise daily such as walking for 30 - 60 minutes daily  Eat a healthy low fat diet  We updated your flu shot today and your Tdap (tetanus diptheria pertussis) vaccine today  Call your insurance about coverage for a bone density scan  Call your insurance about coverage for shingles vaccine  We will call with other instructions regarding toenail fungus   Health Maintenance, Female Introduction Adopting a healthy lifestyle and getting preventive care can go a long way to promote health and wellness. Talk with your health care provider about what schedule of regular examinations is right for you. This is a good chance for you to check in with your provider about disease prevention and staying healthy. In between checkups, there are plenty of things you can do on your own. Experts have done a lot of research about which lifestyle changes and preventive measures are most likely to keep you healthy. Ask your health care provider for more information. Weight and diet Eat a healthy diet  Be sure to include plenty of vegetables, fruits, low-fat dairy products, and lean protein.  Do not eat a lot of foods high in solid fats, added sugars, or salt.  Get regular exercise. This is one of the most important things you can do for your health.  Most adults should exercise for at least 150 minutes each week. The exercise should increase your heart rate and make you sweat (moderate-intensity exercise).  Most adults should also do strengthening exercises at least twice a week. This is in addition to the moderate-intensity exercise. Maintain a healthy weight  Body mass index (BMI) is a measurement that can be used to identify  possible weight problems. It estimates body fat based on height and weight. Your health care provider can help determine your BMI and help you achieve or maintain a healthy weight.  For females 57 years of age and older:  A BMI below 18.5 is considered underweight.  A BMI of 18.5 to 24.9 is normal.  A BMI of 25 to 29.9 is considered overweight.  A BMI of 30 and above is considered obese. Watch levels of cholesterol and blood lipids  You should start having your blood tested for lipids and cholesterol at 57 years of age, then have this test every 5 years. blood tested for lipids and cholesterol at 57 years of age, then have this test every 5 years.  You may need to have your cholesterol levels checked more often if:  Your lipid or cholesterol levels are high.  You are older than 57 years of age.  You are at high risk for heart disease. Cancer screening Lung Cancer  Lung cancer screening is recommended for adults 57-53 years old who are at high risk for lung cancer because of a history of smoking. for adults 10-53 years old who are at high risk for lung cancer because of a history of smoking.  A yearly low-dose CT scan of the lungs is recommended for people who:  Currently smoke.  Have quit within the past 15 years.  Have at least a 30-pack-year history of smoking. A pack year is smoking an average of one pack of cigarettes a day for 1 year.  Yearly screening should continue until it has been 15 years since you quit.  Yearly screening should stop if you develop a health problem that would prevent you from having lung cancer treatment. Breast Cancer  Practice breast self-awareness. This means  understanding how your breasts normally appear and feel.  It also means doing regular breast self-exams. Let your health care provider know about any changes, no matter how small.  If you are in your 20s or 30s, you should have a clinical breast exam (CBE) by a health care provider every 1-3 years as part of a regular health exam.  If you are 51 or older, have a CBE every year. Also consider having a breast X-ray (mammogram) every year.  If you have a family history of  breast cancer, talk to your health care provider about genetic screening.  If you are at high risk for breast cancer, talk to your health care provider about having an MRI and a mammogram every year.  Breast cancer gene (BRCA) assessment is recommended for women who have family members with BRCA-related cancers. BRCA-related cancers include:  Breast.  Ovarian.  Tubal.  Peritoneal cancers.  Results of the assessment will determine the need for genetic counseling and BRCA1 and BRCA2 testing. Cervical Cancer  Your health care provider may recommend that you be screened regularly for cancer of the pelvic organs (ovaries, uterus, and vagina). This screening involves a pelvic examination, including checking for microscopic changes to the surface of your cervix (Pap test). You may be encouraged to have this screening done every 3 years, beginning at age 57.  For women ages 31-65, health care providers may recommend pelvic exams and Pap testing every 3 years, or they may recommend the Pap and pelvic exam, combined with testing for human papilloma virus (HPV), every 5 years. Some types of HPV increase your risk of cervical cancer. Testing for HPV may also be done on women of any age with unclear Pap test results.  Other health care providers may not recommend any screening for nonpregnant women who are considered low risk for pelvic cancer and who do not have symptoms. Ask your health care provider if a screening pelvic exam is right for you.  If you have had past treatment for cervical cancer or a condition that could lead to cancer, you need Pap tests and screening for cancer for at least 20 years after your treatment. If Pap tests have been discontinued, your risk factors (such as having a new sexual partner) need to be reassessed to determine if screening should resume. Some women have medical problems that increase the chance of getting cervical cancer. In these cases, your health care provider may  recommend more frequent screening and Pap tests. Colorectal Cancer  This type of cancer can be detected and often prevented.  Routine colorectal cancer screening usually begins at 57 years of age and continues through 57 years of age.  Your health care provider may recommend screening at an earlier age if you have risk factors for colon cancer.  Your health care provider may also recommend using home test kits to check for hidden blood in the stool.  A small camera at the end of a tube can be used to examine your colon directly (sigmoidoscopy or colonoscopy). This is done to check for the earliest forms of colorectal cancer.  Routine screening usually begins at age 65.  Direct examination of the colon should be repeated every 5-10 years through 57 years of age. However, you may need to be screened more often if early forms of precancerous polyps or small growths are found. Skin Cancer  Check your skin from head to toe regularly.  Tell your health care provider about any new moles or changes  in moles, especially if there is a change in a mole's shape or color.  Also tell your health care provider if you have a mole that is larger than the size of a pencil eraser.  Always use sunscreen. Apply sunscreen liberally and repeatedly throughout the day.  Protect yourself by wearing long sleeves, pants, a wide-brimmed hat, and sunglasses whenever you are outside. Heart disease, diabetes, and high blood pressure  High blood pressure causes heart disease and increases the risk of stroke. High blood pressure is more likely to develop in:  People who have blood pressure in the high end of the normal range (130-139/85-89 mm Hg).  People who are overweight or obese.  People who are African American.  If you are 43-39 years of age, have your blood pressure checked every 3-5 years. If you are 76 years of age or older, have your blood pressure checked every year. You should have your blood pressure  measured twice-once when you are at a hospital or clinic, and once when you are not at a hospital or clinic. Record the average of the two measurements. To check your blood pressure when you are not at a hospital or clinic, you can use:  An automated blood pressure machine at a pharmacy.  A home blood pressure monitor.  If you are between 85 years and 75 years old, ask your health care provider if you should take aspirin to prevent strokes.  Have regular diabetes screenings. This involves taking a blood sample to check your fasting blood sugar level.  If you are at a normal weight and have a low risk for diabetes, have this test once every three years after 57 years of age.  If you are overweight and have a high risk for diabetes, consider being tested at a younger age or more often. Preventing infection Hepatitis B  If you have a higher risk for hepatitis B, you should be screened for this virus. You are considered at high risk for hepatitis B if:  You were born in a country where hepatitis B is common. Ask your health care provider which countries are considered high risk.  Your parents were born in a high-risk country, and you have not been immunized against hepatitis B (hepatitis B vaccine).  You have HIV or AIDS.  You use needles to inject street drugs.  You live with someone who has hepatitis B.  You have had sex with someone who has hepatitis B.  You get hemodialysis treatment.  You take certain medicines for conditions, including cancer, organ transplantation, and autoimmune conditions. Hepatitis C  Blood testing is recommended for:  Everyone born from [birth year] through 1966.  Anyone with known risk factors for hepatitis C. Sexually transmitted infections (STIs)  You should be screened for sexually transmitted infections (STIs) including gonorrhea and chlamydia if:  You are sexually active and are younger than 57 years of age.  You are older than 57 years of age and  your health care provider tells you that you are at risk for this type of infection.  Your sexual activity has changed since you were last screened and you are at an increased risk for chlamydia or gonorrhea. Ask your health care provider if you are at risk.  If you do not have HIV, but are at risk, it may be recommended that you take a prescription medicine daily to prevent HIV infection. This is called pre-exposure prophylaxis (PrEP). You are considered at risk if:  You are sexually active  and do not regularly use condoms or know the HIV status of your partner(s).  You take drugs by injection.  You are sexually active with a partner who has HIV. Talk with your health care provider about whether you are at high risk of being infected with HIV. If you choose to begin PrEP, you should first be tested for HIV. You should then be tested every 3 months for as long as you are taking PrEP. Pregnancy  If you are premenopausal and you may become pregnant, ask your health care provider about preconception counseling.  If you may become pregnant, take 400 to 800 micrograms (mcg) of folic acid every day.  If you want to prevent pregnancy, talk to your health care provider about birth control (contraception). Osteoporosis and menopause  Osteoporosis is a disease in which the bones lose minerals and strength with aging. This can result in serious bone fractures. Your risk for osteoporosis can be identified using a bone density scan.  If you are 77 years of age or older, or if you are at risk for osteoporosis and fractures, ask your health care provider if you should be screened.  Ask your health care provider whether you should take a calcium or vitamin D supplement to lower your risk for osteoporosis.  Menopause may have certain physical symptoms and risks.  Hormone replacement therapy may reduce some of these symptoms and risks. Talk to your health care provider about whether hormone replacement  therapy is right for you. Follow these instructions at home:  Schedule regular health, dental, and eye exams.  Stay current with your immunizations.  Do not use any tobacco products including cigarettes, chewing tobacco, or electronic cigarettes.  If you are pregnant, do not drink alcohol.  If you are breastfeeding, limit how much and how often you drink alcohol.  Limit alcohol intake to no more than 1 drink per day for nonpregnant women. One drink equals 12 ounces of beer, 5 ounces of wine, or 1 ounces of hard liquor.  Do not use street drugs.  Do not share needles.  Ask your health care provider for help if you need support or information about quitting drugs.  Tell your health care provider if you often feel depressed.  Tell your health care provider if you have ever been abused or do not feel safe at home. This information is not intended to replace advice given to you by your health care provider. Make sure you discuss any questions you have with your health care provider. Document Released: 09/18/2010 Document Revised: 08/11/2015 Document Reviewed: 12/07/2014  2017 [employer]

## 2016-04-24 NOTE — Progress Notes (Signed)
Subjective:   HPI  Dana Li is a 56 y.o. female who presents for a complete physical.     Concerns: Toenail thickened and brown, right and left great toenails.  Has itching and athletes foot between toes.  Been using OTC cream x 1 mo  Vertigo somewhat improved from last visit.    No other new c/o.   Reviewed their medical, surgical, family, social, medication, and allergy history and updated chart as appropriate.  Past Medical History:  Diagnosis Date  . Allergic rhinitis   . Atrophic vaginitis   . Constipation   . H/O mammogram 2007  . Menopause   . Vertigo, peripheral    occasional  . Vitamin D deficiency   . Wears eyeglasses     Past Surgical History:  Procedure Laterality Date  . APPENDECTOMY     childhood  . CESAREAN SECTION     x2  . COLONOSCOPY  04/27/2005   Dr. Ardis Hughs, repeat 2017  . EXCISIONAL HEMORRHOIDECTOMY      Social History   Social History  . Marital status: Married    Spouse name: N/A  . Number of children: N/A  . Years of education: N/A   Occupational History  . packaging at [employer]  . Smoking status: Former Smoker    Packs/day: 0.50    Years: 18.00    Types: Cigarettes    Quit date: 03/19/1994  . Smokeless tobacco: Never Used  . Alcohol use 1.2 oz/week    2 Glasses of wine per week     Comment: occasionally  . Drug use: No  . Sexual activity: No   Other Topics Concern  . Not on file   Social History Narrative   Widowed in 03/2008.  No current significant other.  Has 47yo and 32yo children, both in [place].  Exercises some.  Works for [employer].  Her mother lives with her.  Is her caregiver, as she has dementia.   As of 04/2016    Family History  Problem Relation Age of Onset  . Diabetes Mother   . Heart disease Mother 38  . Cancer Mother 46    breast cancer  . Other Father     died of illness  . Asthma Brother     1 died of asthma  . Liver disease Brother   . Stroke  Maternal Grandmother      Current Outpatient Prescriptions:  .  meclizine (ANTIVERT) 25 MG tablet, Take 1 tablet (25 mg total) by mouth 3 (three) times daily as needed., Disp: 30 tablet, Rfl: 0 .  Multiple Vitamins-Minerals (MULTIVITAMIN WITH MINERALS) tablet, Take 1 tablet by mouth daily., Disp: , Rfl:  .  diphenhydrAMINE (BENADRYL) 25 MG tablet, Take 25 mg by mouth every 6 (six) hours as needed for itching. Sleep, Disp: , Rfl:   No Known Allergies   Review of Systems Constitutional: -fever, -chills, -sweats, -unexpected weight change, -decreased appetite, -fatigue Allergy: -sneezing, -itching, -congestion Dermatology: -changing moles, --rash, -lumps, +athletes foot ENT: -runny nose, -ear pain, -sore throat, -hoarseness, -sinus pain, -teeth pain, - ringing in ears, -hearing loss, -nosebleeds Cardiology: -chest pain, -palpitations, -swelling, -difficulty breathing when lying flat, -waking up short of breath Respiratory: -cough, -shortness of breath, -difficulty breathing with exercise or exertion, -wheezing, -coughing up blood Gastroenterology: -abdominal pain, -nausea, -vomiting, -diarrhea, -constipation, -blood in stool, -changes in bowel movement, -difficulty swallowing or eating Hematology: -bleeding, -bruising  Musculoskeletal: -joint aches, -muscle aches, -joint swelling, -  back pain, -neck pain, -cramping, -changes in gait Ophthalmology: denies vision changes, eye redness, itching, discharge Urology: -burning with urination, -difficulty urinating, -blood in urine, -urinary frequency, -urgency, -incontinence Neurology: -headache, -weakness, -tingling, -numbness, -memory loss, -falls, +dizziness Psychology: -depressed mood, -agitation, -sleep problems     Objective:   Physical Exam  BP 112/70   Pulse 62   Ht 4\' 10"  (1.473 m)   Wt 137 lb (62.1 kg)   SpO2 96%   BMI 28.63 kg/m    General appearance: alert, no distress, WD/WN, AA female, pleasant  Skin: scattered benign  appearing macules of arms, face, chest, no worrisome lesions. Right upper chest with football shaped 45mm x 48mm lesion, flat and brown, generalized freckles, thickened brownish toenails of bilat great toes, some mild irritations between some toes HEENT: normocephalic, conjunctiva/corneas normal, sclerae anicteric, PERRLA, EOMi, nares patent, no discharge or erythema, pharynx normal  Oral cavity: MMM, tongue normal, teeth in good repair  Neck: supple, no lymphadenopathy, no thyromegaly, no masses, normal ROM, no bruits  Chest: non tender, normal shape and expansion  Heart: RRR, normal S1, S2, no murmurs  Lungs: CTA bilaterally, no wheezes, rhonchi, or rales  Abdomen: +bs, soft, midline vertical surgical scar inferior to umbilicus, non tender, non distended, no masses, no hepatomegaly, no splenomegaly, no bruits  Back: non tender, normal ROM, no scoliosis  Musculoskeletal: upper extremities non tender, no obvious deformity, normal ROM throughout, lower extremities non tender, no obvious deformity, normal ROM throughout  Extremities: no edema, no cyanosis, no clubbing  Pulses: 2+ symmetric, upper and lower extremities, normal cap refill  Neurological: alert, oriented x 3, CN2-12 intact, strength normal upper extremities and lower extremities, sensation normal throughout, DTRs 2+ throughout, no cerebellar signs, gait normal  Psychiatric: normal affect, behavior normal, pleasant  Breast: nontender, no masses or lumps, no skin changes, no nipple discharge or inversion, no axillary lymphadenopathy  Gyn: Normal external genitalia without lesions, vagina with normal mucosa, cervix without lesions, no cervical motion tenderness, no abnormal vaginal discharge. Uterus and adnexa not enlarged, nontender, no masses. Exam chaperoned by nurse.  Rectal: anus normal appearing    Assessment and Plan :    Encounter Diagnoses  Name Primary?  . Routine general medical examination at a health care facility Yes  .  Need for Tdap vaccination   . Peripheral vertigo, unspecified laterality   . Atrophic vaginitis   . Elevated blood-pressure reading without diagnosis of hypertension   . Menopausal state   . Vitamin D deficiency   . Screening for breast cancer   . Screening for cervical cancer   . Screen for colon cancer   . Need for influenza vaccination   . Onychomycosis   . Tinea pedis of both feet     Physical exam - discussed healthy lifestyle, diet, exercise, preventative care, vaccinations, and addressed their concerns.  Handout given.  Recommendations:  See your eye doctor yearly for routine vision care.  See your dentist yearly for routine dental care including hygiene visits twice yearly.  Scheduled your mammogram  We are referring you back for updating colonoscopy  We will call with lab results  Exercise daily such as walking for 30 - 60 minutes daily  Eat a healthy low fat diet  We updated your flu shot today and your Tdap (tetanus diptheria pertussis) vaccine today  Call your insurance about coverage for a bone density scan  Call your insurance about coverage for shingles vaccine  We will call with other instructions regarding  toenail fungus  Counseled on the influenza virus vaccine.  Vaccine information sheet given.  Influenza vaccine given after consent obtained.  Counseled on the Tdap (tetanus, diptheria, and acellular pertussis) vaccine.  Vaccine information sheet given. Tdap vaccine given after consent obtained.  Pending labs, consider oral Lamisil given tinea (mild) and onychomycosis)  Follow-up pending labs   [place] was seen today for physical.  Diagnoses and all orders for this visit:  Routine general medical examination at a health care facility -     Urinalysis Dipstick -     Cytology - PAP -     MM DIGITAL SCREENING BILATERAL; Future -     Ambulatory referral to Gastroenterology -     Comprehensive metabolic panel -     Lipid panel -     CBC -      TSH -     VITAMIN D 25 Hydroxy (Vit-D Deficiency, Fractures)  Need for Tdap vaccination  Peripheral vertigo, unspecified laterality  Atrophic vaginitis  Elevated blood-pressure reading without diagnosis of hypertension  Menopausal state  Vitamin D deficiency  Screening for breast cancer -     MM DIGITAL SCREENING BILATERAL; Future  Screening for cervical cancer -     Cytology - PAP  Screen for colon cancer -     Ambulatory referral to Gastroenterology  Need for influenza vaccination  Onychomycosis  Tinea pedis of both feet

## 2016-04-25 ENCOUNTER — Other Ambulatory Visit: Payer: Self-pay | Admitting: Medical

## 2016-04-25 ENCOUNTER — Other Ambulatory Visit: Payer: Self-pay

## 2016-04-25 DIAGNOSIS — Z1211 Encounter for screening for malignant neoplasm of colon: Secondary | ICD-10-CM

## 2016-04-25 LAB — CYTOLOGY - PAP: Diagnosis: NEGATIVE

## 2016-04-25 LAB — VITAMIN D 25 HYDROXY (VIT D DEFICIENCY, FRACTURES): VIT D 25 HYDROXY: 13 ng/mL — AB (ref 30–100)

## 2016-04-25 MED ORDER — TERBINAFINE HCL 250 MG PO TABS: 250.0000 mg | ORAL_TABLET | Freq: Every day | ORAL | 0 refills | Status: DC

## 2016-04-25 MED ORDER — VITAMIN D (ERGOCALCIFEROL) 1.25 MG (50000 UNIT) PO CAPS: 50000.0000 [IU] | ORAL_CAPSULE | ORAL | 3 refills | Status: DC

## 2016-04-27 ENCOUNTER — Other Ambulatory Visit: Payer: Self-pay | Admitting: Medical

## 2016-04-27 DIAGNOSIS — Z1231 Encounter for screening mammogram for malignant neoplasm of breast: Secondary | ICD-10-CM

## 2016-05-02 ENCOUNTER — Ambulatory Visit
Admission: RE | Admit: 2016-05-02 | Discharge: 2016-05-02 | Disposition: A | Discharge status: Home / Self Care | Payer: BLUE CROSS/BLUE SHIELD | Source: Ambulatory Visit | Attending: Medical | Admitting: Medical

## 2016-05-02 DIAGNOSIS — Z1231 Encounter for screening mammogram for malignant neoplasm of breast: Secondary | ICD-10-CM

## 2016-05-03 ENCOUNTER — Encounter: Payer: Self-pay | Admitting: Internal Medicine

## 2016-05-04 ENCOUNTER — Encounter: Payer: Self-pay | Admitting: Gastroenterology

## 2016-06-02 ENCOUNTER — Other Ambulatory Visit: Payer: Self-pay | Admitting: Medical

## 2016-06-17 HISTORY — PX: COLONOSCOPY: SHX174

## 2016-06-20 ENCOUNTER — Ambulatory Visit: Payer: BLUE CROSS/BLUE SHIELD | Admitting: *Deleted

## 2016-06-20 VITALS — Ht <= 58 in | Wt 136.2 lb

## 2016-06-20 DIAGNOSIS — Z1211 Encounter for screening for malignant neoplasm of colon: Secondary | ICD-10-CM

## 2016-06-20 MED ORDER — NA SULFATE-K SULFATE-MG SULF 17.5-3.13-1.6 GM/177ML PO SOLN: 1.0000 | Freq: Once | ORAL | 0 refills | Status: AC

## 2016-06-20 NOTE — Progress Notes (Signed)
Denies allergies to eggs or soy products. Denies complications with sedation or anesthesia. Denies O2 use. Denies use of diet or weight loss medications.  Emmi instructions given for colonoscopy.  

## 2016-06-21 ENCOUNTER — Encounter: Payer: Self-pay | Admitting: Gastroenterology

## 2016-07-04 ENCOUNTER — Ambulatory Visit (AMBULATORY_SURGERY_CENTER): Payer: BLUE CROSS/BLUE SHIELD | Admitting: Gastroenterology

## 2016-07-04 ENCOUNTER — Encounter: Payer: Self-pay | Admitting: Gastroenterology

## 2016-07-04 VITALS — BP 136/74 | HR 59 | Temp 97.1°F | Resp 20 | Ht <= 58 in | Wt 136.0 lb

## 2016-07-04 DIAGNOSIS — Z1212 Encounter for screening for malignant neoplasm of rectum: Secondary | ICD-10-CM | POA: Diagnosis not present

## 2016-07-04 DIAGNOSIS — Z1211 Encounter for screening for malignant neoplasm of colon: Secondary | ICD-10-CM | POA: Diagnosis not present

## 2016-07-04 DIAGNOSIS — D123 Benign neoplasm of transverse colon: Secondary | ICD-10-CM

## 2016-07-04 MED ORDER — SODIUM CHLORIDE 0.9 % IV SOLN
500.0000 mL | INTRAVENOUS | Status: AC
Start: 2016-07-04 — End: ?

## 2016-07-04 NOTE — Progress Notes (Signed)
Called to room to assist during endoscopic procedure.  Patient ID and intended procedure confirmed with present staff. Received instructions for my participation in the procedure from the performing physician.  

## 2016-07-04 NOTE — Patient Instructions (Signed)
YOU HAD AN ENDOSCOPIC PROCEDURE TODAY AT THE [facility]:   Refer to the procedure report that was given to you for any specific questions about what was found during the examination.  If the procedure report does not answer your questions, please call your gastroenterologist to clarify.  If you requested that your care partner not be given the details of your procedure findings, then the procedure report has been included in a sealed envelope for you to review at your convenience later.  YOU SHOULD EXPECT: Some feelings of bloating in the abdomen. Passage of more gas than usual.  Walking can help get rid of the air that was put into your GI tract during the procedure and reduce the bloating. If you had a lower endoscopy (such as a colonoscopy or flexible sigmoidoscopy) you may notice spotting of blood in your stool or on the toilet paper. If you underwent a bowel prep for your procedure, you may not have a normal bowel movement for a few days.  Please Note:  You might notice some irritation and congestion in your nose or some drainage.  This is from the oxygen used during your procedure.  There is no need for concern and it should clear up in a day or so.  SYMPTOMS TO REPORT IMMEDIATELY:   Following lower endoscopy (colonoscopy or flexible sigmoidoscopy):  Excessive amounts of blood in the stool  Significant tenderness or worsening of abdominal pains  Swelling of the abdomen that is new, acute  Fever of 100F or higher    For urgent or emergent issues, a gastroenterologist can be reached at any hour by calling [phone].   DIET:  We do recommend a small meal at first, but then you may proceed to your regular diet.  Drink plenty of fluids but you should avoid alcoholic beverages for 24 hours.  ACTIVITY:  You should plan to take it easy for the rest of today and you should NOT DRIVE or use heavy machinery until tomorrow (because of the sedation medicines used during the test).     FOLLOW UP: Our staff will call the number listed on your records the next business day following your procedure to check on you and address any questions or concerns that you may have regarding the information given to you following your procedure. If we do not reach you, we will leave a message.  However, if you are feeling well and you are not experiencing any problems, there is no need to return our call.  We will assume that you have returned to your regular daily activities without incident.  If any biopsies were taken you will be contacted by phone or by letter within the next 1-3 weeks.  Please call us at [phone] if you have not heard about the biopsies in 3 weeks.    SIGNATURES/CONFIDENTIALITY: You and/or your care partner have signed paperwork which will be entered into your electronic medical record.  These signatures attest to the fact that that the information above on your After Visit Summary has been reviewed and is understood.  Full responsibility of the confidentiality of this discharge information lies with you and/or your care-partner.   Resume medications. Information given on polyps. 

## 2016-07-04 NOTE — Progress Notes (Signed)
Pt's states no medical or surgical changes since previsit or office visit. 

## 2016-07-04 NOTE — Op Note (Signed)
[facility] Patient Name: Dana Li Procedure Date: 07/04/2016 3:01 PM MRN: [id number] Endoscopist: Milus Banister , MD Age: 56 Referring MD:  Date of Birth: 1961-03-11 Gender: Female Account #: [id number] Procedure:                Colonoscopy Indications:              Screening for colorectal malignant neoplasm;                            colonoscopy 2007 found no polyps Medicines:                Monitored Anesthesia Care Procedure:                Pre-Anesthesia Assessment:                           - Prior to the procedure, a History and Physical                            was performed, and patient medications and                            allergies were reviewed. The patient's tolerance of                            previous anesthesia was also reviewed. The risks                            and benefits of the procedure and the sedation                            options and risks were discussed with the patient.                            All questions were answered, and informed consent                            was obtained. Prior Anticoagulants: The patient has                            taken no previous anticoagulant or antiplatelet                            agents. ASA Grade Assessment: II - A patient with                            mild systemic disease. After reviewing the risks                            and benefits, the patient was deemed in                            satisfactory condition to undergo the procedure.  After obtaining informed consent, the colonoscope                            was passed under direct vision. Throughout the                            procedure, the patient's blood pressure, pulse, and                            oxygen saturations were monitored continuously. The                            Colonoscope was introduced through the anus and                            advanced to the the cecum,  identified by                            appendiceal orifice and ileocecal valve. The                            colonoscopy was performed without difficulty. The                            patient tolerated the procedure well. The quality                            of the bowel preparation was excellent. The                            ileocecal valve, appendiceal orifice, and rectum                            were photographed. Scope In: 3:13:21 PM Scope Out: 3:22:08 PM Scope Withdrawal Time: 0 hours 6 minutes 12 seconds  Total Procedure Duration: 0 hours 8 minutes 47 seconds  Findings:                 A 4 mm polyp was found in the transverse colon. The                            polyp was sessile. The polyp was removed with a                            cold snare. Resection and retrieval were complete.                           The exam was otherwise without abnormality on                            direct and retroflexion views. Complications:            No immediate complications. Estimated blood loss:  None. Estimated Blood Loss:     Estimated blood loss: none. Impression:               - One 4 mm polyp in the transverse colon, removed                            with a cold snare. Resected and retrieved.                           - The examination was otherwise normal on direct                            and retroflexion views. Recommendation:           - Patient has a contact number available for                            emergencies. The signs and symptoms of potential                            delayed complications were discussed with the                            patient. Return to normal activities tomorrow.                            Written discharge instructions were provided to the                            patient.                           - Resume previous diet.                           - Continue present medications.                            You will receive a letter within 2-3 weeks with the                            pathology results and my final recommendations.                           If the polyp(s) is proven to be 'pre-cancerous' on                            pathology, you will need repeat colonoscopy in 5                            years. If the polyp(s) is NOT 'precancerous' on                            pathology then you should repeat colon cancer  screening in 10 years with colonoscopy without need                            for colon cancer screening by any method prior to                            then (including stool testing). Milus Banister, MD 07/04/2016 3:31:53 PM This report has been signed electronically.

## 2016-07-04 NOTE — Progress Notes (Signed)
Report to PACU, RN, vss, BBS= Clear.  

## 2016-07-05 ENCOUNTER — Telehealth: Payer: Self-pay

## 2016-07-05 ENCOUNTER — Telehealth: Payer: Self-pay | Admitting: *Deleted

## 2016-07-05 NOTE — Telephone Encounter (Signed)
  Follow up Call-  Call back number 07/04/2016  Post procedure Call Back phone  # [phone]  Permission to leave phone message Yes  Some recent data might be hidden     LMOM for pt to call us back if any new/worsening symptoms, questions, etc Dana Li

## 2016-07-05 NOTE — Telephone Encounter (Signed)
  Follow up Call-  Call back number 07/04/2016  Post procedure Call Back phone  # [phone]  Permission to leave phone message Yes  Some recent data might be hidden    [facility]

## 2016-07-10 ENCOUNTER — Encounter: Payer: Self-pay | Admitting: Gastroenterology

## 2016-12-10 DIAGNOSIS — M2740 Unspecified cyst of jaw: Secondary | ICD-10-CM | POA: Diagnosis not present

## 2017-01-07 DIAGNOSIS — M2740 Unspecified cyst of jaw: Secondary | ICD-10-CM | POA: Diagnosis not present

## 2017-01-17 DIAGNOSIS — M279 Disease of jaws, unspecified: Secondary | ICD-10-CM | POA: Diagnosis not present

## 2017-01-29 DIAGNOSIS — M279 Disease of jaws, unspecified: Secondary | ICD-10-CM | POA: Diagnosis not present

## 2017-06-13 ENCOUNTER — Other Ambulatory Visit: Payer: Self-pay | Admitting: Medical

## 2017-06-13 DIAGNOSIS — Z1231 Encounter for screening mammogram for malignant neoplasm of breast: Secondary | ICD-10-CM

## 2017-07-05 ENCOUNTER — Ambulatory Visit
Admission: RE | Admit: 2017-07-05 | Discharge: 2017-07-05 | Disposition: A | Discharge status: Home / Self Care | Payer: BLUE CROSS/BLUE SHIELD | Source: Ambulatory Visit | Attending: Medical | Admitting: Medical

## 2017-07-05 DIAGNOSIS — Z1231 Encounter for screening mammogram for malignant neoplasm of breast: Secondary | ICD-10-CM | POA: Diagnosis not present

## 2017-08-28 ENCOUNTER — Encounter: Payer: BLUE CROSS/BLUE SHIELD | Admitting: Medical

## 2017-09-05 ENCOUNTER — Encounter: Payer: Self-pay | Admitting: Medical

## 2017-09-05 ENCOUNTER — Ambulatory Visit: Payer: BLUE CROSS/BLUE SHIELD | Admitting: Medical

## 2017-09-05 VITALS — BP 120/72 | HR 80 | Resp 16 | Ht 58.5 in | Wt 141.8 lb

## 2017-09-05 DIAGNOSIS — Z1239 Encounter for other screening for malignant neoplasm of breast: Secondary | ICD-10-CM

## 2017-09-05 DIAGNOSIS — Z0000 Encounter for general adult medical examination without abnormal findings: Secondary | ICD-10-CM | POA: Diagnosis not present

## 2017-09-05 DIAGNOSIS — Z1231 Encounter for screening mammogram for malignant neoplasm of breast: Secondary | ICD-10-CM | POA: Diagnosis not present

## 2017-09-05 DIAGNOSIS — Z136 Encounter for screening for cardiovascular disorders: Secondary | ICD-10-CM | POA: Diagnosis not present

## 2017-09-05 DIAGNOSIS — E559 Vitamin D deficiency, unspecified: Secondary | ICD-10-CM

## 2017-09-05 DIAGNOSIS — E2839 Other primary ovarian failure: Secondary | ICD-10-CM | POA: Diagnosis not present

## 2017-09-05 DIAGNOSIS — Z780 Asymptomatic menopausal state: Secondary | ICD-10-CM | POA: Diagnosis not present

## 2017-09-05 DIAGNOSIS — L989 Disorder of the skin and subcutaneous tissue, unspecified: Secondary | ICD-10-CM | POA: Diagnosis not present

## 2017-09-05 DIAGNOSIS — Z7189 Other specified counseling: Secondary | ICD-10-CM

## 2017-09-05 DIAGNOSIS — N952 Postmenopausal atrophic vaginitis: Secondary | ICD-10-CM | POA: Diagnosis not present

## 2017-09-05 DIAGNOSIS — Z7185 Encounter for immunization safety counseling: Secondary | ICD-10-CM | POA: Insufficient documentation

## 2017-09-05 LAB — POCT URINALYSIS DIP (PROADVANTAGE DEVICE)
Bilirubin, UA: NEGATIVE
Glucose, UA: NEGATIVE mg/dL
Ketones, POC UA: NEGATIVE mg/dL
Leukocytes, UA: NEGATIVE
NITRITE UA: NEGATIVE
PH UA: 6 (ref 5.0–8.0)
Protein Ur, POC: NEGATIVE mg/dL
Specific Gravity, Urine: 1.03
UUROB: 3.5

## 2017-09-05 MED ORDER — ESTROGENS, CONJUGATED 0.625 MG/GM VA CREA: TOPICAL_CREAM | VAGINAL | 12 refills | Status: AC

## 2017-09-05 NOTE — Addendum Note (Signed)
Addended by: Gwinda Maine on: 09/05/2017 01:14 PM   Modules accepted: Orders

## 2017-09-05 NOTE — Patient Instructions (Signed)
Thanks for trusting [place] with your health care and for coming in for a physical today.  Below are some general recommendations I have for you:  Yearly screenings See your eye doctor yearly for routine vision care. See your dentist yearly for routine dental care including hygiene visits twice yearly. See me here yearly for a routine physical and preventative care visit   Cancer screening Colon cancer screening:  You are up to date on colonoscopy screening.     Breast cancer screening -  Please call to schedule your mammogram at your convenience next April.  [facility]   [phone]          [phone] [address], [place], [address] [place], [place] [address]        [place], [place] [address]  Osteoporosis screening/Bone Density test - I recommend a bone density test if you are over 75 years old, or under 69 years old if you have risk factors such as a bone fracture as an adult, parent with history of bone fracture as adult, thyroid disease, early menopause, or underweight for example.  Check insurance coverage for this  Shingles vaccine:  I recommend you have a shingles vaccine to help prevent shingles or herpes zoster outbreak.   Please call your insurer to inquire about coverage for the Shingrix vaccine given in 2 doses.   Some insurers cover this vaccine after age 62, some cover this after age 46.  If your insurer covers this, then call to schedule appointment to have this vaccine here.    Please follow up yearly for a physical.    I have included other useful information below for your review.  Preventative Care for Adults - Female      MAINTAIN REGULAR HEALTH EXAMS:  A routine yearly physical is a good way to check in with your primary care provider about your health and preventive screening. It is also an opportunity to share updates about your health and any concerns you have, and receive a  thorough all-over exam.   Most health insurance companies pay for at least some preventative services.  Check with your health plan for specific coverages.  WHAT PREVENTATIVE SERVICES DO WOMEN NEED?  Adult women should have their weight and blood pressure checked regularly.   Women age 49 and older should have their cholesterol levels checked regularly.  Women should be screened for cervical cancer with a Pap smear and pelvic exam beginning at either age 19, or 3 years after they become sexually activity.    Breast cancer screening generally begins at age 24 with a mammogram and breast exam by your primary care provider.    Beginning at age 64 and continuing to age 67, women should be screened for colorectal cancer.  Certain people may need continued testing until age 54.  Updating vaccinations is part of preventative care.  Vaccinations help protect against diseases such as the flu.  Osteoporosis is a disease in which the bones lose minerals and strength as we age. Women ages 58 and over should discuss this with their caregivers, as should women after menopause who have other risk factors.  Lab tests are generally done as part of preventative care to screen for anemia and blood disorders, to screen for problems with the kidneys and liver, to screen for bladder problems, to check blood sugar, and to check your cholesterol  level.  Preventative services generally include counseling about diet, exercise, avoiding tobacco, drugs, excessive alcohol consumption, and sexually transmitted infections.    GENERAL RECOMMENDATIONS FOR GOOD HEALTH:  Healthy diet:  Eat a variety of foods, including fruit, vegetables, animal or vegetable protein, such as meat, fish, chicken, and eggs, or beans, lentils, tofu, and grains, such as rice.  Drink plenty of water daily.  Decrease saturated fat in the diet, avoid lots of red meat, processed foods, sweets, fast foods, and fried  foods.  Exercise:  Aerobic exercise helps maintain good heart health. At least 30-40 minutes of moderate-intensity exercise is recommended. For example, a brisk walk that increases your heart rate and breathing. This should be done on most days of the week.   Find a type of exercise or a variety of exercises that you enjoy so that it becomes a part of your daily life.  Examples are running, walking, swimming, water aerobics, and biking.  For motivation and support, explore group exercise such as aerobic class, spin class, Zumba, Yoga,or  martial arts, etc.    Set exercise goals for yourself, such as a certain weight goal, walk or run in a race such as a 5k walk/run.  Speak to your primary care provider about exercise goals.  Disease prevention:  If you smoke or chew tobacco, find out from your caregiver how to quit. It can literally save your life, no matter how long you have been a tobacco user. If you do not use tobacco, never begin.   Maintain a healthy diet and normal weight. Increased weight leads to problems with blood pressure and diabetes.   The Body Mass Index or BMI is a way of measuring how much of your body is fat. Having a BMI above 27 increases the risk of heart disease, diabetes, hypertension, stroke and other problems related to obesity. Your caregiver can help determine your BMI and based on it develop an exercise and dietary program to help you achieve or maintain this important measurement at a healthful level.  High blood pressure causes heart and blood vessel problems.  Persistent high blood pressure should be treated with medicine if weight loss and exercise do not work.   Fat and cholesterol leaves deposits in your arteries that can block them. This causes heart disease and vessel disease elsewhere in your body.  If your cholesterol is found to be high, or if you have heart disease or certain other medical conditions, then you may need to have your cholesterol monitored  frequently and be treated with medication.   Ask if you should have a cardiac stress test if your history suggests this. A stress test is a test done on a treadmill that looks for heart disease. This test can find disease prior to there being a problem.  Menopause can be associated with physical symptoms and risks. Hormone replacement therapy is available to decrease these. You should talk to your caregiver about whether starting or continuing to take hormones is right for you.   Osteoporosis is a disease in which the bones lose minerals and strength as we age. This can result in serious bone fractures. Risk of osteoporosis can be identified using a bone density scan. Women ages 52 and over should discuss this with their caregivers, as should women after menopause who have other risk factors. Ask your caregiver whether you should be taking a calcium supplement and Vitamin D, to reduce the rate of osteoporosis.   Avoid drinking alcohol in excess (more  than two drinks per day).  Avoid use of street drugs. Do not share needles with anyone. Ask for professional help if you need assistance or instructions on stopping the use of alcohol, cigarettes, and/or drugs.  Brush your teeth twice a day with fluoride toothpaste, and floss once a day. Good oral hygiene prevents tooth decay and gum disease. The problems can be painful, unattractive, and can cause other health problems. Visit your dentist for a routine oral and dental check up and preventive care every 6-12 months.   Look at your skin regularly.  Use a mirror to look at your back. Notify your caregivers of changes in moles, especially if there are changes in shapes, colors, a size larger than a pencil eraser, an irregular border, or development of new moles.  Safety:  Use seatbelts 100% of the time, whether driving or as a passenger.  Use safety devices such as hearing protection if you work in environments with loud noise or significant background  noise.  Use safety glasses when doing any work that could send debris in to the eyes.  Use a helmet if you ride a bike or motorcycle.  Use appropriate safety gear for contact sports.  Talk to your caregiver about gun safety.  Use sunscreen with a SPF (or skin protection factor) of 15 or greater.  Lighter skinned people are at a greater risk of skin cancer. Don't forget to also wear sunglasses in order to protect your eyes from too much damaging sunlight. Damaging sunlight can accelerate cataract formation.   Practice safe sex. Use condoms. Condoms are used for birth control and to help reduce the spread of sexually transmitted infections (or STIs).  Some of the STIs are gonorrhea (the clap), chlamydia, syphilis, trichomonas, herpes, HPV (human papilloma virus) and HIV (human immunodeficiency virus) which causes AIDS. The herpes, HIV and HPV are viral illnesses that have no cure. These can result in disability, cancer and death.   Keep carbon monoxide and smoke detectors in your home functioning at all times. Change the batteries every 6 months or use a model that plugs into the wall.   Vaccinations:  Stay up to date with your tetanus shots and other required immunizations. You should have a booster for tetanus every 10 years. Be sure to get your flu shot every year, since 5%-20% of the [place] population comes down with the flu. The flu vaccine changes each year, so being vaccinated once is not enough. Get your shot in the fall, before the flu season peaks.   Other vaccines to consider:  Human Papilloma Virus or HPV causes cancer of the cervix, and other infections that can be transmitted from person to person. There is a vaccine for HPV, and females should get immunized between the ages of 74 and 78. It requires a series of 3 shots.   Pneumococcal vaccine to protect against certain types of pneumonia.  This is normally recommended for adults age 7 or older.  However, adults younger than 58 years old  with certain underlying conditions such as diabetes, heart or lung disease should also receive the vaccine.  Shingles vaccine to protect against Varicella Zoster if you are older than age 17, or younger than 58 years old with certain underlying illness.  If you have not had the Shingrix vaccine, please call your insurer to inquire about coverage for the Shingrix vaccine given in 2 doses.   Some insurers cover this vaccine after age 54, some cover this after age 35.  If your insurer covers this, then call to schedule appointment to have this vaccine here  Hepatitis A vaccine to protect against a form of infection of the liver by a virus acquired from food.  Hepatitis B vaccine to protect against a form of infection of the liver by a virus acquired from blood or body fluids, particularly if you work in health care.  If you plan to travel internationally, check with your local health department for specific vaccination recommendations.  Cancer Screening:  Breast cancer screening is essential to preventive care for women. All women age 33 and older should perform a breast self-exam every month. At age 23 and older, women should have their caregiver complete a breast exam each year. Women at ages 78 and older should have a mammogram (x-ray film) of the breasts. Your caregiver can discuss how often you need mammograms.    Cervical cancer screening includes taking a Pap smear (sample of cells examined under a microscope) from the cervix (end of the uterus). It also includes testing for HPV (Human Papilloma Virus, which can cause cervical cancer). Screening and a pelvic exam should begin at age 2, or 3 years after a woman becomes sexually active. Screening should occur every year, with a Pap smear but no HPV testing, up to age 27. After age 77, you should have a Pap smear every 3 years with HPV testing, if no HPV was found previously.   Most routine colon cancer screening begins at the age of 35. On a  yearly basis, doctors may provide special easy to use take-home tests to check for hidden blood in the stool. Sigmoidoscopy or colonoscopy can detect the earliest forms of colon cancer and is life saving. These tests use a small camera at the end of a tube to directly examine the colon. Speak to your caregiver about this at age 75, when routine screening begins (and is repeated every 5 years unless early forms of pre-cancerous polyps or small growths are found).

## 2017-09-05 NOTE — Progress Notes (Signed)
Subjective:   HPI  Dana Li is a 57 y.o. female who presents for a complete physical.     Concerns: She has had hot flashes for years since last menstrual period around age 24.  They have actually gotten a little better in recent years.  She does get night sweats also similar for years.  Denies fevers.  No cough.  Her only concern really is Vaginal dryness  her mother passed away this past year   No other new c/o.   Reviewed their medical, surgical, family, social, medication, and allergy history and updated chart as appropriate.  Past Medical History:  Diagnosis Date  . Allergic rhinitis   . Atrophic vaginitis   . Constipation   . H/O mammogram 2007  . Menopause   . Seasonal allergies   . Vertigo, peripheral    occasional  . Vitamin D deficiency   . Wears eyeglasses     Past Surgical History:  Procedure Laterality Date  . APPENDECTOMY     childhood  . CESAREAN SECTION     x2  . COLONOSCOPY  06/2016   4/19 tubular adenoma polyp, Dr. Ardis Hughs, prior 2007  . EXCISIONAL HEMORRHOIDECTOMY      Social History   Socioeconomic History  . Marital status: Married    Spouse name: Not on file  . Number of children: Not on file  . Years of education: Not on file  . Highest education level: Not on file  Occupational History  . Occupation: Pensions consultant at [employer]: [place]  . Financial resource strain: Not on file  . Food insecurity:    Worry: Not on file    Inability: Not on file  . Transportation needs:    Medical: Not on file    Non-medical: Not on file  Tobacco Use  . Smoking status: Former Smoker    Packs/day: 0.50    Years: 18.00    Pack years: 9.00    Types: Cigarettes    Last attempt to quit: 03/19/1994    Years since quitting: 23.4  . Smokeless tobacco: Never Used  Substance and Sexual Activity  . Alcohol use: Yes    Alcohol/week: 1.2 oz    Types: 2 Glasses of wine per week    Comment: occasionally  . Drug use: No  . Sexual  activity: Never  Lifestyle  . Physical activity:    Days per week: Not on file    Minutes per session: Not on file  . Stress: Not on file  Relationships  . Social connections:    Talks on phone: Not on file    Gets together: Not on file    Attends religious service: Not on file    Active member of club or organization: Not on file    Attends meetings of clubs or organizations: Not on file    Relationship status: Not on file  . Intimate partner violence:    Fear of current or ex partner: Not on file    Emotionally abused: Not on file    Physically abused: Not on file    Forced sexual activity: Not on file  Other Topics Concern  . Not on file  Social History Narrative   Widowed in 03/2008.  No current significant other.  Has 35yo and 33yo children, both in [place].  Exercises some.  Works for [employer].   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   As of 08/2017    Family History  Problem Relation Age of Onset  . Diabetes Mother   . Heart disease Mother 34       died of MI 07/11/2016  . Cancer Mother 49       breast cancer  . Other Father        died of illness  . Asthma Brother        1 died of asthma  . Liver disease Brother   . Stroke Maternal Grandmother   . Colon cancer Paternal Uncle 36  . Stroke Brother   . Esophageal cancer Neg Hx   . Rectal cancer Neg Hx   . Stomach cancer Neg Hx      Current Outpatient Medications:  .  diphenhydrAMINE (BENADRYL) 25 MG tablet, Take 25 mg by mouth every 6 (six) hours as needed for itching. Sleep, Disp: , Rfl:  .  Multiple Vitamins-Minerals (MULTIVITAMIN WITH MINERALS) tablet, Take 1 tablet by mouth daily., Disp: , Rfl:  .  conjugated estrogens (PREMARIN) vaginal cream, Use pea size amount 2-3 days per week, Disp: 42.5 g, Rfl: 12 .  meclizine (ANTIVERT) 25 MG tablet, Take 1 tablet (25 mg total) by mouth 3 (three) times daily as needed. (Patient not taking: Reported on 09/05/2017), Disp: 30 tablet, Rfl: 0 .  Vitamin D, Ergocalciferol,  (DRISDOL) 50000 units CAPS capsule, Take 1 capsule (50,000 Units total) by mouth every 7 (seven) days. (Patient not taking: Reported on 09/05/2017), Disp: 12 capsule, Rfl: 3  Current Facility-Administered Medications:  .  0.9 %  sodium chloride infusion, 500 mL, Intravenous, Continuous, Milus Banister, MD  No Known Allergies   Review of Systems Constitutional: -fever, -chills, -sweats, -unexpected weight change, -decreased appetite, -fatigue Allergy: -sneezing, -itching, -congestion Dermatology: -changing moles, --rash, -lumps, +athletes foot ENT: -runny nose, -ear pain, -sore throat, -hoarseness, -sinus pain, -teeth pain, - ringing in ears, -hearing loss, -nosebleeds Cardiology: -chest pain, -palpitations, -swelling, -difficulty breathing when lying flat, -waking up short of breath Respiratory: -cough, -shortness of breath, -difficulty breathing with exercise or exertion, -wheezing, -coughing up blood Gastroenterology: -abdominal pain, -nausea, -vomiting, -diarrhea, -constipation, -blood in stool, -changes in bowel movement, -difficulty swallowing or eating Hematology: -bleeding, -bruising  Musculoskeletal: -joint aches, -muscle aches, -joint swelling, -back pain, -neck pain, -cramping, -changes in gait Ophthalmology: denies vision changes, eye redness, itching, discharge Urology: -burning with urination, -difficulty urinating, -blood in urine, -urinary frequency, -urgency, -incontinence Neurology: -headache, -weakness, -tingling, -numbness, -memory loss, -falls, +dizziness Psychology: -depressed mood, -agitation, -sleep problems     Objective:   Physical Exam  BP 120/72   Pulse 80   Resp 16   Ht 4' 10.5" (1.486 m)   Wt 141 lb 12.8 oz (64.3 kg)   SpO2 97%   BMI 29.13 kg/m   General appearance: alert, no distress, WD/WN, AA female, pleasant  Skin: scattered benign appearing macules of arms, benign pedunculated skin tags, small of right anterolateral neck and posterolateral right  neck, face, chest, no worrisome lesions. Right upper chest with football shaped 5mm x 4mm lesion, flat and brown, generalized freckles, thickened brownish toenails of bilat great toes, some mild irritations between some toes HEENT: normocephalic, conjunctiva/corneas normal, sclerae anicteric, PERRLA, EOMi, nares patent, no discharge or erythema, pharynx normal  Oral cavity: MMM, tongue normal, teeth in good repair  Neck: supple, no lymphadenopathy, no thyromegaly, no masses, normal ROM, no bruits  Chest: non tender, normal shape and expansion  Heart: RRR, normal S1, S2, no murmurs  Lungs: CTA bilaterally, no wheezes, rhonchi, or rales  Abdomen: +  bs, soft, midline vertical surgical scar inferior to umbilicus, non tender, non distended, no masses, no hepatomegaly, no splenomegaly, no bruits  Back: non tender, normal ROM, no scoliosis  Musculoskeletal: upper extremities non tender, no obvious deformity, normal ROM throughout, lower extremities non tender, no obvious deformity, normal ROM throughout  Extremities: no edema, no cyanosis, no clubbing  Pulses: 2+ symmetric, upper and lower extremities, normal cap refill  Neurological: alert, oriented x 3, CN2-12 intact, strength normal upper extremities and lower extremities, sensation normal throughout, DTRs 2+ throughout, no cerebellar signs, gait normal  Psychiatric: normal affect, behavior normal, pleasant  Breast/gyn/rectal - declined   Adult ECG Report  Indication: screen for heart disease, physical  Rate: 61 bpm  Rhythm: normal sinus rhythm  QRS Axis: 26 degrees  PR Interval: 166ms  QRS Duration: 71ms  QTc: 464ms  Conduction Disturbances: none  Other Abnormalities: none  Patient's cardiac risk factors are: none.  EKG comparison: none  Narrative Interpretation: normal EKG     Assessment and Plan :    Encounter Diagnoses  Name Primary?  . Routine general medical examination at a health care facility Yes  . Vitamin D deficiency   .  Atrophic vaginitis   . Screening for breast cancer   . Post-menopausal   . Estrogen deficiency   . Screening for heart disease   . Vaccine counseling   . Skin lesion     Physical exam - discussed healthy lifestyle, diet, exercise, preventative care, vaccinations, and addressed their concerns.  Handout given.  Recommendations:  See your eye doctor yearly for routine vision care.  See your dentist yearly for routine dental care including hygiene visits twice yearly.  Scheduled your mammogram April 2019  Reviewed 06/2016 colonoscopy  We will call with lab results  Exercise daily such as walking for 30 - 60 minutes daily  Eat a healthy low fat diet  Call your insurance about coverage for a bone density scan  Call your insurance about coverage for shingles vaccine  Begin trial of premarin cream for atrophic vaginitis.  Discussed risks/benefits and proper use of medication  Follow-up pending labs   Emberlynn was seen today for annual exam.  Diagnoses and all orders for this visit:  Routine general medical examination at a health care facility -     EKG 12-Lead -     VITAMIN D 25 Hydroxy (Vit-D Deficiency, Fractures) -     DG Bone Density; Future  Vitamin D deficiency -     VITAMIN D 25 Hydroxy (Vit-D Deficiency, Fractures) -     DG Bone Density; Future  Atrophic vaginitis  Screening for breast cancer  Post-menopausal  Estrogen deficiency -     DG Bone Density; Future  Screening for heart disease -     EKG 12-Lead  Vaccine counseling  Skin lesion  Other orders -     conjugated estrogens (PREMARIN) vaginal cream; Use pea size amount 2-3 days per week

## 2017-09-06 LAB — VITAMIN D 25 HYDROXY (VIT D DEFICIENCY, FRACTURES): VIT D 25 HYDROXY: 17.5 ng/mL — AB (ref 30.0–100.0)

## 2017-09-08 ENCOUNTER — Other Ambulatory Visit: Payer: Self-pay | Admitting: Medical

## 2017-09-08 MED ORDER — VITAMIN D (ERGOCALCIFEROL) 1.25 MG (50000 UNIT) PO CAPS: 50000.0000 [IU] | ORAL_CAPSULE | ORAL | 3 refills | Status: DC

## 2017-09-09 ENCOUNTER — Other Ambulatory Visit: Payer: Self-pay | Admitting: Medical

## 2017-09-18 ENCOUNTER — Other Ambulatory Visit: Payer: Self-pay

## 2018-01-15 DIAGNOSIS — E569 Vitamin deficiency, unspecified: Secondary | ICD-10-CM | POA: Diagnosis not present

## 2018-07-19 ENCOUNTER — Emergency Department (HOSPITAL_COMMUNITY): Payer: BLUE CROSS/BLUE SHIELD

## 2018-07-19 ENCOUNTER — Encounter (HOSPITAL_COMMUNITY): Payer: Self-pay

## 2018-07-19 ENCOUNTER — Emergency Department (HOSPITAL_COMMUNITY)
Admission: EM | Admit: 2018-07-19 | Discharge: 2018-07-19 | Disposition: A | Discharge status: Home / Self Care | Payer: BLUE CROSS/BLUE SHIELD | Attending: Emergency Medicine | Admitting: Emergency Medicine

## 2018-07-19 ENCOUNTER — Other Ambulatory Visit: Payer: Self-pay

## 2018-07-19 DIAGNOSIS — R079 Chest pain, unspecified: Secondary | ICD-10-CM | POA: Diagnosis not present

## 2018-07-19 DIAGNOSIS — R05 Cough: Secondary | ICD-10-CM | POA: Diagnosis not present

## 2018-07-19 DIAGNOSIS — R0789 Other chest pain: Secondary | ICD-10-CM

## 2018-07-19 DIAGNOSIS — Z79899 Other long term (current) drug therapy: Secondary | ICD-10-CM | POA: Insufficient documentation

## 2018-07-19 DIAGNOSIS — Z87891 Personal history of nicotine dependence: Secondary | ICD-10-CM | POA: Diagnosis not present

## 2018-07-19 LAB — COMPREHENSIVE METABOLIC PANEL
ALT: 21 U/L (ref 0–44)
AST: 42 U/L — ABNORMAL HIGH (ref 15–41)
Albumin: 3.9 g/dL (ref 3.5–5.0)
Alkaline Phosphatase: 86 U/L (ref 38–126)
Anion gap: 11 (ref 5–15)
BUN: 12 mg/dL (ref 6–20)
CO2: 22 mmol/L (ref 22–32)
Calcium: 9.2 mg/dL (ref 8.9–10.3)
Chloride: 108 mmol/L (ref 98–111)
Creatinine, Ser: 0.74 mg/dL (ref 0.44–1.00)
GFR calc Af Amer: >60 mL/min (ref >60)
GFR calc non Af Amer: >60 mL/min (ref >60)
Glucose, Bld: 104 mg/dL — ABNORMAL HIGH (ref 70–99)
Potassium: 3.9 mmol/L (ref 3.5–5.1)
Sodium: 141 mmol/L (ref 135–145)
Total Bilirubin: 0.6 mg/dL (ref 0.3–1.2)
Total Protein: 7.3 g/dL (ref 6.5–8.1)

## 2018-07-19 LAB — CBC WITH DIFFERENTIAL/PLATELET
Abs Immature Granulocytes: 0.01 10*3/uL (ref 0.00–0.07)
Basophils Absolute: 0.1 10*3/uL (ref 0.0–0.1)
Basophils Relative: 1 %
Eosinophils Absolute: 0.2 10*3/uL (ref 0.0–0.5)
Eosinophils Relative: 4 %
HCT: 41.4 % (ref 36.0–46.0)
Hemoglobin: 13.4 g/dL (ref 12.0–15.0)
Immature Granulocytes: 0 %
Lymphocytes Relative: 29 %
Lymphs Abs: 1.8 10*3/uL (ref 0.7–4.0)
MCH: 30 pg (ref 26.0–34.0)
MCHC: 32.4 g/dL (ref 30.0–36.0)
MCV: 92.8 fL (ref 80.0–100.0)
Monocytes Absolute: 0.4 10*3/uL (ref 0.1–1.0)
Monocytes Relative: 7 %
Neutro Abs: 3.6 10*3/uL (ref 1.7–7.7)
Neutrophils Relative %: 59 %
Platelets: 263 10*3/uL (ref 150–400)
RBC: 4.46 MIL/uL (ref 3.87–5.11)
RDW: 13.2 % (ref 11.5–15.5)
WBC: 6.1 10*3/uL (ref 4.0–10.5)
nRBC: 0 % (ref 0.0–0.2)

## 2018-07-19 LAB — TROPONIN I
Troponin I: <0.03 ng/mL (ref <0.03)
Troponin I: <0.03 ng/mL (ref <0.03)

## 2018-07-19 MED ORDER — ASPIRIN 81 MG PO CHEW
324.0000 mg | CHEWABLE_TABLET | Freq: Once | ORAL | Status: AC
  Administered 2018-07-19: 14:00:00 324 mg via ORAL
  Filled 2018-07-19: qty 4

## 2018-07-19 NOTE — ED Provider Notes (Signed)
Care assumed from [facility], PA-C, at shift change, please see their notes for full documentation of patient's complaint/HPI. Briefly, pt here with intermittent R sided chest pain x3 days. Results so far show CBC w/diff WNL, CMP essentially WNL, trop neg x1, CXR neg, EKG with nonspecific ST/T changes but nothing appearing acutely ischemic. Awaiting Second trop at 4:50pm. Plan is to reassess after that.   Physical Exam  BP (!) 149/91 (BP Location: Right Arm)   Pulse 68   Temp 98.7 F (37.1 C) (Oral)   Resp 13   Ht 4\' 11"  (1.499 m)   Wt 63.5 kg   SpO2 100%   BMI 28.28 kg/m   Physical Exam Gen: afebrile, VSS, NAD HEENT: EOMI Resp: no resp distress, CTAB in all lung fields, no w/r/r, no hypoxia or increased WOB, speaking in full sentences, SpO2 100% on RA  CV: RRR, nl s1/s2, no m/r/g, distal pulses intact, no chest wall tenderness Abd: appearance normal, nondistended MsK: moving all extremities with ease Neuro: A&O  ED Course/Procedures    Results for orders placed or performed during the hospital encounter of 07/19/18  CBC with Differential  Result Value Ref Range   WBC 6.1 4.0 - 10.5 K/uL   RBC 4.46 3.87 - 5.11 MIL/uL   Hemoglobin 13.4 12.0 - 15.0 g/dL   HCT 41.4 36.0 - 46.0 %   MCV 92.8 80.0 - 100.0 fL   MCH 30.0 26.0 - 34.0 pg   MCHC 32.4 30.0 - 36.0 g/dL   RDW 13.2 11.5 - 15.5 %   Platelets 263 150 - 400 K/uL   nRBC 0.0 0.0 - 0.2 %   Neutrophils Relative % 59 %   Neutro Abs 3.6 1.7 - 7.7 K/uL   Lymphocytes Relative 29 %   Lymphs Abs 1.8 0.7 - 4.0 K/uL   Monocytes Relative 7 %   Monocytes Absolute 0.4 0.1 - 1.0 K/uL   Eosinophils Relative 4 %   Eosinophils Absolute 0.2 0.0 - 0.5 K/uL   Basophils Relative 1 %   Basophils Absolute 0.1 0.0 - 0.1 K/uL   Immature Granulocytes 0 %   Abs Immature Granulocytes 0.01 0.00 - 0.07 K/uL  Comprehensive metabolic panel  Result Value Ref Range   Sodium 141 135 - 145 mmol/L   Potassium 3.9 3.5 - 5.1 mmol/L   Chloride 108 98  - 111 mmol/L   CO2 22 22 - 32 mmol/L   Glucose, Bld 104 (H) 70 - 99 mg/dL   BUN 12 6 - 20 mg/dL   Creatinine, Ser 0.74 0.44 - 1.00 mg/dL   Calcium 9.2 8.9 - 10.3 mg/dL   Total Protein 7.3 6.5 - 8.1 g/dL   Albumin 3.9 3.5 - 5.0 g/dL   AST 42 (H) 15 - 41 U/L   ALT 21 0 - 44 U/L   Alkaline Phosphatase 86 38 - 126 U/L   Total Bilirubin 0.6 0.3 - 1.2 mg/dL   GFR calc non Af Amer >60 >60 mL/min   GFR calc Af Amer >60 >60 mL/min   Anion gap 11 5 - 15  Troponin I - ONCE - STAT  Result Value Ref Range   Troponin I <0.03 <0.03 ng/mL  Troponin I - Once-Timed  Result Value Ref Range   Troponin I <0.03 <0.03 ng/mL   Dg Chest 2 View  Result Date: 07/19/2018 CLINICAL DATA:  Right-sided chest pain since Wednesday, no shortness of breath EXAM: CHEST - 2 VIEW COMPARISON:  06/13/2005 FINDINGS: The heart size  and mediastinal contours are within normal limits. Both lungs are clear. The visualized skeletal structures are unremarkable. IMPRESSION: No active cardiopulmonary disease. Electronically Signed   By: Kathreen Devoid   On: 07/19/2018 13:34   EKG Interpretation  Date/Time:  Saturday Jul 19 2018 12:46:01 EDT Ventricular Rate:  66 PR Interval:    QRS Duration: 100 QT Interval:  419 QTC Calculation: 439 R Axis:   39 Text Interpretation:  Sinus rhythm ST-t wave abnormality Abnormal ekg Confirmed by Carmin Muskrat [phone]) on 07/19/2018 3:47:29 PM          Meds ordered this encounter  Medications  . aspirin chewable tablet 324 mg     MDM:   ICD-10-CM   1. Atypical chest pain R07.89     6:05 PM Second trop neg. Pt feeling well. Doubt need for further emergent work up. Advised OTC remedies for symptomatic relief, f/up with PCP for recheck. Strict return precautions advised. I explained the diagnosis and have given explicit precautions to return to the ER including for any other new or worsening symptoms. The patient understands and accepts the medical plan as it's been dictated and I have  answered their questions. Discharge instructions concerning home care and prescriptions have been given. The patient is STABLE and is discharged to home in good condition.     [address], [place], [place] 07/19/18 1805    Duffy Bruce, MD 07/20/18 [phone]

## 2018-07-19 NOTE — ED Notes (Signed)
Patient verbalizes understanding of discharge instructions. Opportunity for questioning and answers were provided. Pt discharged from ED. 

## 2018-07-19 NOTE — Discharge Instructions (Signed)
Evaluated today for chest pain. Your EKG, imaging and labs were unremarkable.  This is likely a chest wall pain.  I would suggest following up with your PCP for reevaluation if you continue to have symptoms.  May take Tylenol or ibuprofen for symptoms.  Return to the ED for any new or worsening symptoms, chest pain becomes exertional nature, you feel lightheaded, dizzy, start sweating or feel short of breath.

## 2018-07-19 NOTE — ED Provider Notes (Signed)
[facility] EMERGENCY DEPARTMENT Provider Note   CSN: [id number] Arrival date & time: 07/19/18  1234  History   Chief Complaint Chief Complaint  Patient presents with  . Chest Pain    HPI Dana Li is a 58 y.o. female with past medical history significant for hyperlipidemia who presents for evaluation of chest pain.  Patient has had right-sided chest pain x3 days.  Pain does not radiate.  Pain nonexertional nonpleuritic in nature.  Described as a dull aching.  Says it is worse with twisting at her chest.  She has had nonproductive cough x1 week.  Denies associated lightheadedness, dizziness, diaphoresis, lower extremity erythema, swelling, pain to bilateral calves, recent mobilization, surgery, history of PE or DVT.  Denies fever, chills, nausea, vomiting, shortness of breath, abdominal pain, emesis, melena, hematochezia, diarrhea, dysuria.  Rates her current pain a 4/10.  Described as a dull aching.  Denies history of hypertension, diabetes, family history of early MI, history of dissection or aneurysm.  Has not taken anything for pain PTA.  Denies additional aggravating or alleviating factors.  Denies recent travel or recent exposure to coronavirus positive patients.  Patient was seen via telemedicine visit today and was told to proceed to the emergency department for evaluation.  History obtained from patient.  No interpreter was used.     HPI  Past Medical History:  Diagnosis Date  . Allergic rhinitis   . Atrophic vaginitis   . Constipation   . H/O mammogram 2007  . Menopause   . Seasonal allergies   . Vertigo, peripheral    occasional  . Vitamin D deficiency   . Wears eyeglasses     Patient Active Problem List   Diagnosis Date Noted  . Post-menopausal 09/05/2017  . Estrogen deficiency 09/05/2017  . Screening for heart disease 09/05/2017  . Vaccine counseling 09/05/2017  . Skin lesion 09/05/2017  . Routine general medical examination at a health  care facility 04/24/2016  . Screening for breast cancer 04/24/2016  . Screening for cervical cancer 04/24/2016  . Screen for colon cancer 04/24/2016  . Need for influenza vaccination 04/24/2016  . Onychomycosis 04/24/2016  . Tinea pedis of both feet 04/24/2016  . Elevated blood-pressure reading without diagnosis of hypertension 04/18/2016  . Vertigo, peripheral 01/14/2014  . Menopausal state 01/14/2014  . Atrophic vaginitis 01/14/2014  . Vitamin D deficiency 01/14/2014    Past Surgical History:  Procedure Laterality Date  . APPENDECTOMY     childhood  . CESAREAN SECTION     x2  . COLONOSCOPY  06/2016   4/19 tubular adenoma polyp, Dr. Ardis Hughs, prior 2007  . EXCISIONAL HEMORRHOIDECTOMY       OB History   No obstetric history on file.      Home Medications    Prior to Admission medications   Medication Sig Start Date End Date Taking? Authorizing Provider  conjugated estrogens (PREMARIN) vaginal cream Use pea size amount 2-3 days per week 09/05/17   Tysinger, Camelia Eng, PA-C  diphenhydrAMINE (BENADRYL) 25 MG tablet Take 25 mg by mouth every 6 (six) hours as needed for itching. Sleep    [provider]  meclizine (ANTIVERT) 25 MG tablet Take 1 tablet (25 mg total) by mouth 3 (three) times daily as needed. Patient not taking: Reported on 09/05/2017 04/18/16   Tysinger, Camelia Eng, PA-C  Multiple Vitamins-Minerals (MULTIVITAMIN WITH MINERALS) tablet Take 1 tablet by mouth daily.    [provider]  Vitamin D, Ergocalciferol, (DRISDOL) 50000 units  CAPS capsule Take 1 capsule (50,000 Units total) by mouth every 7 (seven) days. 09/08/17   Tysinger, Camelia Eng, PA-C    Family History Family History  Problem Relation Age of Onset  . Diabetes Mother   . Heart disease Mother 51       died of MI July 02, 2016  . Cancer Mother 24       breast cancer  . Other Father        died of illness  . Asthma Brother        1 died of asthma  . Liver disease Brother   . Stroke Maternal  Grandmother   . Colon cancer Paternal Uncle 35  . Stroke Brother   . Esophageal cancer Neg Hx   . Rectal cancer Neg Hx   . Stomach cancer Neg Hx     Social History Social History   Tobacco Use  . Smoking status: Former Smoker    Packs/day: 0.50    Years: 18.00    Pack years: 9.00    Types: Cigarettes    Last attempt to quit: 03/19/1994    Years since quitting: 24.3  . Smokeless tobacco: Never Used  Substance Use Topics  . Alcohol use: Yes    Alcohol/week: 2.0 standard drinks    Types: 2 Glasses of wine per week    Comment: occasionally  . Drug use: No     Allergies   Patient has no known allergies.   Review of Systems Review of Systems  Constitutional: Negative.   HENT: Negative.   Respiratory: Negative.   Cardiovascular: Positive for chest pain. Negative for palpitations and leg swelling.  Gastrointestinal: Negative.   Genitourinary: Negative.   Musculoskeletal: Negative.   Skin: Negative.   Neurological: Negative.   All other systems reviewed and are negative.    Physical Exam Updated Vital Signs BP (!) 149/91 (BP Location: Right Arm)   Pulse 68   Temp 98.7 F (37.1 C) (Oral)   Resp 13   Ht 4\' 11"  (1.499 m)   Wt 63.5 kg   SpO2 100%   BMI 28.28 kg/m   Physical Exam Vitals signs and nursing note reviewed.  Constitutional:      General: She is not in acute distress.    Appearance: She is well-developed. She is not ill-appearing, toxic-appearing or diaphoretic.  HENT:     Head: Normocephalic and atraumatic.  Eyes:     Pupils: Pupils are equal, round, and reactive to light.  Neck:     Musculoskeletal: Normal range of motion and neck supple.     Comments: No neck stiffness or neck rigidity.  No meningismus.  No carotid bruits. Cardiovascular:     Rate and Rhythm: Normal rate and regular rhythm.     Comments: No murmurs, rubs or gallops.  2+ radial pulses, 2+ PT pulses bilaterally Pulmonary:     Effort: Pulmonary effort is normal. No respiratory  distress.     Comments: Clear to auscultation bilateral wheeze, rhonchi rales.  No accessory muscle usage.  Speaks in full sentences without difficulty. Chest:       Comments: Chest wall TTP to upper right portion of chest.  No crepitus, edema or step-offs.  No tenderness to ribs. Abdominal:     General: There is no distension.     Palpations: Abdomen is soft.     Comments: Soft, nontender without rebound or guarding.  Normoactive bowel sounds.  No overlying skin changes to abdominal wall.  Musculoskeletal: Normal range of  motion.     Comments: Moves all 4 extremities without difficulty.  No lower extremity edema, erythema, ecchymosis or warmth.  Nontender to bilateral calves.  Skin:    General: Skin is warm and dry.     Comments: Brisk capillary refill.  No overlying skin changes.  Neurological:     Mental Status: She is alert.     Comments: Cranial nerves II through XII grossly intact.  No facial droop.  Phonation normal.    ED Treatments / Results  Labs (all labs ordered are listed, but only abnormal results are displayed) Labs Reviewed  COMPREHENSIVE METABOLIC PANEL - Abnormal; Notable for the following components:      Result Value   Glucose, Bld 104 (*)    AST 42 (*)    All other components within normal limits  CBC WITH DIFFERENTIAL/PLATELET  TROPONIN I  TROPONIN I    EKG EKG Interpretation  Date/Time:  Saturday Jul 19 2018 12:46:01 EDT Ventricular Rate:  66 PR Interval:    QRS Duration: 100 QT Interval:  419 QTC Calculation: 439 R Axis:   39 Text Interpretation:  Sinus rhythm ST-t wave abnormality Abnormal ekg Confirmed by Carmin Muskrat [phone]) on 07/19/2018 3:47:29 PM   Radiology Dg Chest 2 View  Result Date: 07/19/2018 CLINICAL DATA:  Right-sided chest pain since Wednesday, no shortness of breath EXAM: CHEST - 2 VIEW COMPARISON:  06/13/2005 FINDINGS: The heart size and mediastinal contours are within normal limits. Both lungs are clear. The visualized  skeletal structures are unremarkable. IMPRESSION: No active cardiopulmonary disease. Electronically Signed   By: Kathreen Devoid   On: 07/19/2018 13:34    Procedures Procedures (including critical care time)  Medications Ordered in ED Medications  aspirin chewable tablet 324 mg (324 mg Oral Given 07/19/18 1416)     Initial Impression / Assessment and Plan / ED Course  I have reviewed the triage vital signs and the nursing notes.  Pertinent labs & imaging results that were available during my care of the patient were reviewed by me and considered in my medical decision making (see chart for details).  58 year old female appears otherwise well presents for evaluation of chest pain.  Afebrile, nonseptic, non-ill-appearing.  Pain x3 days.  Located right upper side of chest.  Reducible to palpation. Nonexertional in nature.  No associated lightheadedness, dizziness, diaphoresis, nonpleuritic pain.  She has no risk factors for PE. Heart score- 3 (Age, Risk factor, EKG changes) PERC- cannot use due to age, Rock Nephew low risk.  Lungs clear.  Heart without murmur, rubs or gallops.  Vital signs stable.  No tachycardia, tachypnea or hypoxia.  Will obtain labs, EKG, imaging and reevaluate.  Labs and imaging personally reviewed:  CBC without leukocytosis, hemoglobin 13.4 CMP without significant electrolyte, renal or liver abnormality Troponin negative Chest x-ray without evidence of pulmonary edema, cardiomegaly, pneumothorax, infiltrates EKG without ischemic changes, no STEMI  Low suspicion for ACS, PE, dissection, reflux, [place], pericarditis, myocarditis, pneumothorax as cause of patient's symptoms.  Chest pain is not likely of cardiac or pulmonary etiology d/t presentation, PERC negative, VSS, no tracheal deviation, no JVD or new murmur, RRR, breath sounds equal bilaterally, EKG without acute abnormalities, negative troponin, and negative CXR.   1545: On reevaluation patient without any pain.  Pain  is reproduced with palpation.  [place] [facility] as cause of her pain. No crepitus, skin changes.  She will receive delta troponin.    Patient care transferred to Street, PA-C at shift change to determine ultimate treatment,  plan and disposition.      Final Clinical Impressions(s) / ED Diagnoses   Final diagnoses:  Atypical chest pain    ED Discharge Orders    None       ,  A, PA-C 07/19/18 1555    Duffy Bruce, MD 07/20/18 [phone]

## 2018-07-19 NOTE — ED Triage Notes (Signed)
Pt has had chest pain since Wednesday & while on her virtual visit with Dr. Epimenio Foot suggested for her to come to the ED for an EKG. Denies pain radiation, & lightheadedness/dizziness.

## 2018-10-02 ENCOUNTER — Other Ambulatory Visit: Payer: Self-pay | Admitting: Medical

## 2018-10-02 NOTE — Telephone Encounter (Signed)
Give 30 day supply and get in for physical, fasting

## 2018-10-02 NOTE — Telephone Encounter (Signed)
LVM for  Pt to confirm she is still Dana Li pt and to ask if she is still taking Vitamin D2. Pt is due for a recheck if she is . Vayas

## 2018-10-02 NOTE — Telephone Encounter (Signed)
[facility] is requesting to fill pt Vit D 50,000 units. Please advise due to pt having this script for a year. [place]

## 2018-10-17 ENCOUNTER — Other Ambulatory Visit: Payer: Self-pay | Admitting: Medical

## 2019-01-22 DIAGNOSIS — Z0000 Encounter for general adult medical examination without abnormal findings: Secondary | ICD-10-CM | POA: Diagnosis not present

## 2019-01-22 DIAGNOSIS — E569 Vitamin deficiency, unspecified: Secondary | ICD-10-CM | POA: Diagnosis not present

## 2019-01-29 DIAGNOSIS — Z124 Encounter for screening for malignant neoplasm of cervix: Secondary | ICD-10-CM | POA: Diagnosis not present

## 2019-01-29 DIAGNOSIS — Z1151 Encounter for screening for human papillomavirus (HPV): Secondary | ICD-10-CM | POA: Diagnosis not present

## 2019-01-29 DIAGNOSIS — Z0000 Encounter for general adult medical examination without abnormal findings: Secondary | ICD-10-CM | POA: Diagnosis not present

## 2019-01-29 DIAGNOSIS — Z23 Encounter for immunization: Secondary | ICD-10-CM | POA: Diagnosis not present

## 2019-07-22 ENCOUNTER — Other Ambulatory Visit: Payer: Self-pay | Admitting: Family Medicine

## 2019-07-22 DIAGNOSIS — Z1231 Encounter for screening mammogram for malignant neoplasm of breast: Secondary | ICD-10-CM

## 2019-09-16 DIAGNOSIS — Z77122 Contact with and (suspected) exposure to noise: Secondary | ICD-10-CM | POA: Diagnosis not present

## 2019-09-16 DIAGNOSIS — H6123 Impacted cerumen, bilateral: Secondary | ICD-10-CM | POA: Diagnosis not present

## 2019-09-16 DIAGNOSIS — H9193 Unspecified hearing loss, bilateral: Secondary | ICD-10-CM | POA: Diagnosis not present

## 2019-09-25 ENCOUNTER — Other Ambulatory Visit: Payer: Self-pay

## 2019-09-25 ENCOUNTER — Ambulatory Visit
Admission: RE | Admit: 2019-09-25 | Discharge: 2019-09-25 | Disposition: A | Discharge status: Home / Self Care | Payer: BC Managed Care – PPO | Source: Ambulatory Visit | Attending: Family Medicine | Admitting: Family Medicine

## 2019-09-25 DIAGNOSIS — Z1231 Encounter for screening mammogram for malignant neoplasm of breast: Secondary | ICD-10-CM

## 2019-11-26 DIAGNOSIS — M79605 Pain in left leg: Secondary | ICD-10-CM | POA: Diagnosis not present

## 2019-11-27 DIAGNOSIS — H52222 Regular astigmatism, left eye: Secondary | ICD-10-CM | POA: Diagnosis not present

## 2020-01-26 DIAGNOSIS — E559 Vitamin D deficiency, unspecified: Secondary | ICD-10-CM | POA: Diagnosis not present

## 2020-01-26 DIAGNOSIS — E569 Vitamin deficiency, unspecified: Secondary | ICD-10-CM | POA: Diagnosis not present

## 2020-01-26 DIAGNOSIS — R7309 Other abnormal glucose: Secondary | ICD-10-CM | POA: Diagnosis not present

## 2020-01-26 DIAGNOSIS — R7989 Other specified abnormal findings of blood chemistry: Secondary | ICD-10-CM | POA: Diagnosis not present

## 2020-01-26 DIAGNOSIS — E7800 Pure hypercholesterolemia, unspecified: Secondary | ICD-10-CM | POA: Diagnosis not present

## 2020-01-26 DIAGNOSIS — Z0000 Encounter for general adult medical examination without abnormal findings: Secondary | ICD-10-CM | POA: Diagnosis not present

## 2020-02-02 DIAGNOSIS — Z23 Encounter for immunization: Secondary | ICD-10-CM | POA: Diagnosis not present

## 2020-02-02 DIAGNOSIS — E7800 Pure hypercholesterolemia, unspecified: Secondary | ICD-10-CM | POA: Diagnosis not present

## 2020-02-02 DIAGNOSIS — Z0000 Encounter for general adult medical examination without abnormal findings: Secondary | ICD-10-CM | POA: Diagnosis not present

## 2020-02-10 DIAGNOSIS — R300 Dysuria: Secondary | ICD-10-CM | POA: Diagnosis not present

## 2020-04-01 DIAGNOSIS — Z20822 Contact with and (suspected) exposure to covid-19: Secondary | ICD-10-CM | POA: Diagnosis not present

## 2020-11-29 ENCOUNTER — Ambulatory Visit
Admission: RE | Admit: 2020-11-29 | Discharge: 2020-11-29 | Disposition: A | Discharge status: Home / Self Care | Payer: BC Managed Care – PPO | Source: Ambulatory Visit | Attending: Family Medicine | Admitting: Family Medicine

## 2020-11-29 ENCOUNTER — Other Ambulatory Visit: Payer: Self-pay | Admitting: Family Medicine

## 2020-11-29 ENCOUNTER — Other Ambulatory Visit: Payer: Self-pay

## 2020-11-29 DIAGNOSIS — Z1231 Encounter for screening mammogram for malignant neoplasm of breast: Secondary | ICD-10-CM | POA: Diagnosis not present

## 2020-12-07 DIAGNOSIS — M549 Dorsalgia, unspecified: Secondary | ICD-10-CM | POA: Diagnosis not present

## 2021-01-26 DIAGNOSIS — E7800 Pure hypercholesterolemia, unspecified: Secondary | ICD-10-CM | POA: Diagnosis not present

## 2021-01-26 DIAGNOSIS — R7309 Other abnormal glucose: Secondary | ICD-10-CM | POA: Diagnosis not present

## 2021-01-26 DIAGNOSIS — E559 Vitamin D deficiency, unspecified: Secondary | ICD-10-CM | POA: Diagnosis not present

## 2021-01-26 DIAGNOSIS — Z0000 Encounter for general adult medical examination without abnormal findings: Secondary | ICD-10-CM | POA: Diagnosis not present

## 2021-01-26 DIAGNOSIS — R7989 Other specified abnormal findings of blood chemistry: Secondary | ICD-10-CM | POA: Diagnosis not present

## 2021-02-02 DIAGNOSIS — Z0000 Encounter for general adult medical examination without abnormal findings: Secondary | ICD-10-CM | POA: Diagnosis not present

## 2021-02-02 DIAGNOSIS — R7309 Other abnormal glucose: Secondary | ICD-10-CM | POA: Diagnosis not present

## 2021-02-02 DIAGNOSIS — Z23 Encounter for immunization: Secondary | ICD-10-CM | POA: Diagnosis not present

## 2021-02-02 DIAGNOSIS — Z87891 Personal history of nicotine dependence: Secondary | ICD-10-CM | POA: Diagnosis not present

## 2021-02-02 DIAGNOSIS — E7800 Pure hypercholesterolemia, unspecified: Secondary | ICD-10-CM | POA: Diagnosis not present

## 2021-02-03 ENCOUNTER — Other Ambulatory Visit: Payer: Self-pay | Admitting: Family Medicine

## 2021-02-03 DIAGNOSIS — Z87891 Personal history of nicotine dependence: Secondary | ICD-10-CM

## 2021-05-22 ENCOUNTER — Encounter: Payer: Self-pay | Admitting: Gastroenterology

## 2022-01-09 ENCOUNTER — Other Ambulatory Visit: Payer: Self-pay | Admitting: Family Medicine

## 2022-01-09 DIAGNOSIS — Z1231 Encounter for screening mammogram for malignant neoplasm of breast: Secondary | ICD-10-CM

## 2022-03-05 ENCOUNTER — Ambulatory Visit
Admission: RE | Admit: 2022-03-05 | Discharge: 2022-03-05 | Disposition: A | Discharge status: Home / Self Care | Payer: Managed Care, Other (non HMO) | Source: Ambulatory Visit | Attending: Family Medicine | Admitting: Family Medicine

## 2022-03-05 ENCOUNTER — Ambulatory Visit: Payer: BC Managed Care – PPO

## 2022-03-05 DIAGNOSIS — Z1231 Encounter for screening mammogram for malignant neoplasm of breast: Secondary | ICD-10-CM

## 2022-09-25 IMAGING — MG MM DIGITAL SCREENING BILAT W/ TOMO AND CAD
6 of 10 series · 6 of 30 positions shown · non-contrast
Comparison: Previous exam(s).

CLINICAL DATA: Screening.

EXAM:
DIGITAL SCREENING BILATERAL MAMMOGRAM WITH TOMOSYNTHESIS AND CAD
TECHNIQUE: Bilateral screening digital craniocaudal and mediolateral oblique
mammograms were obtained. Bilateral screening digital breast
tomosynthesis was performed. The images were evaluated with
computer-aided detection.

[L MLO synth-2D (1 of 2)]
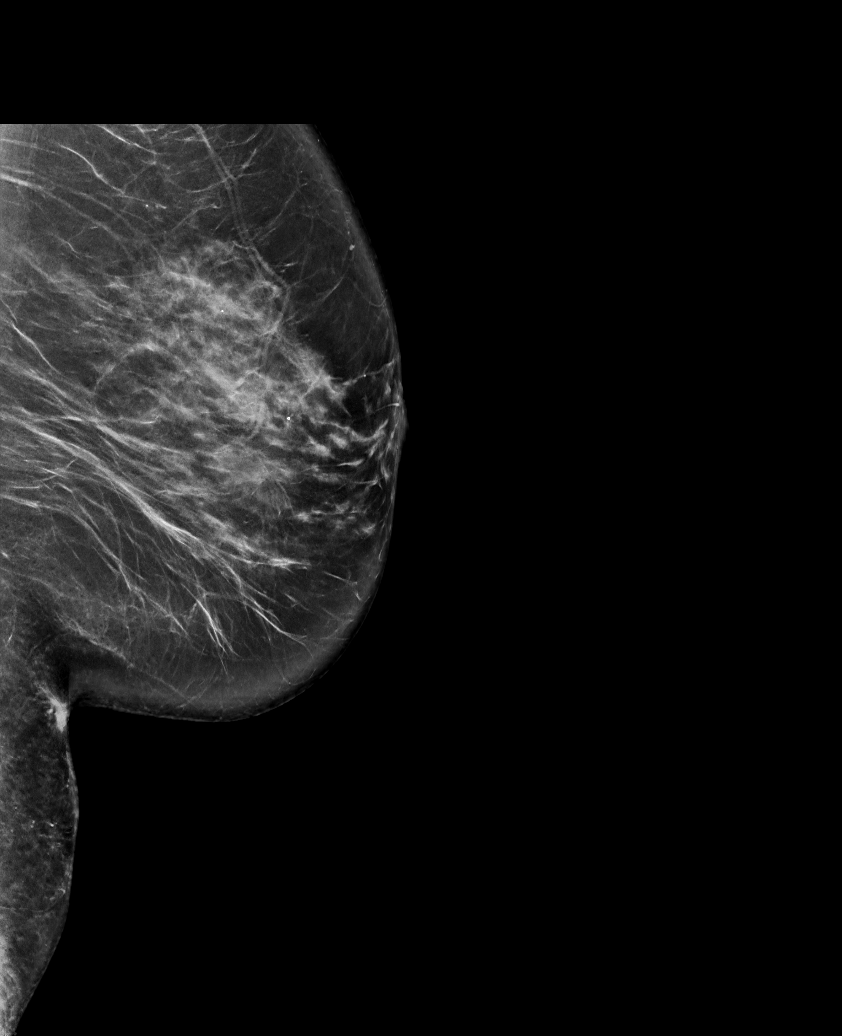

[L MLO synth-2D (2 of 2)]
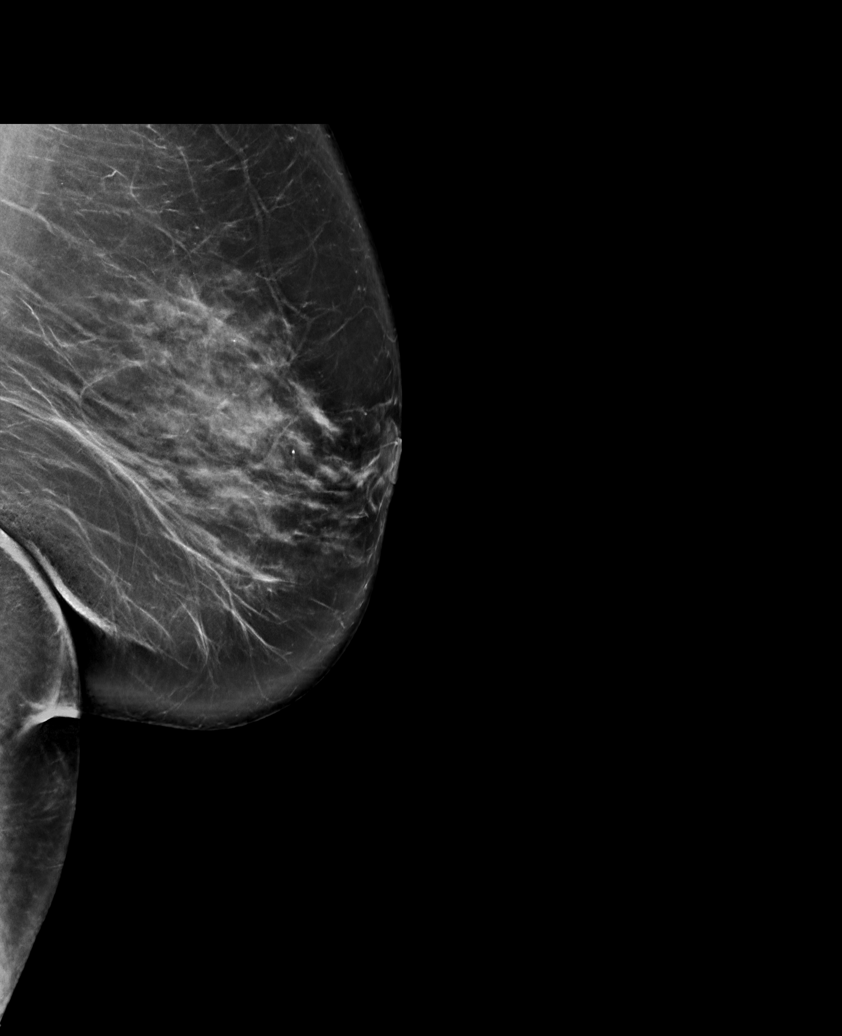

[R CC synth-2D]
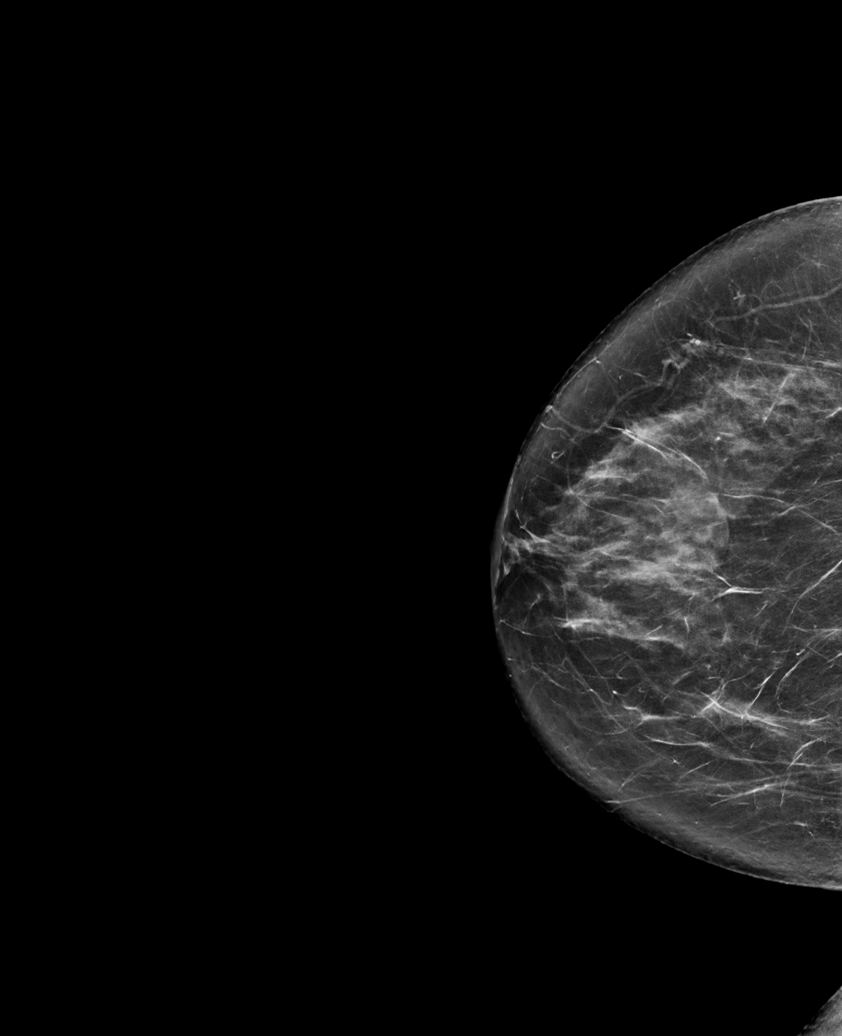

[R MLO synth-2D]
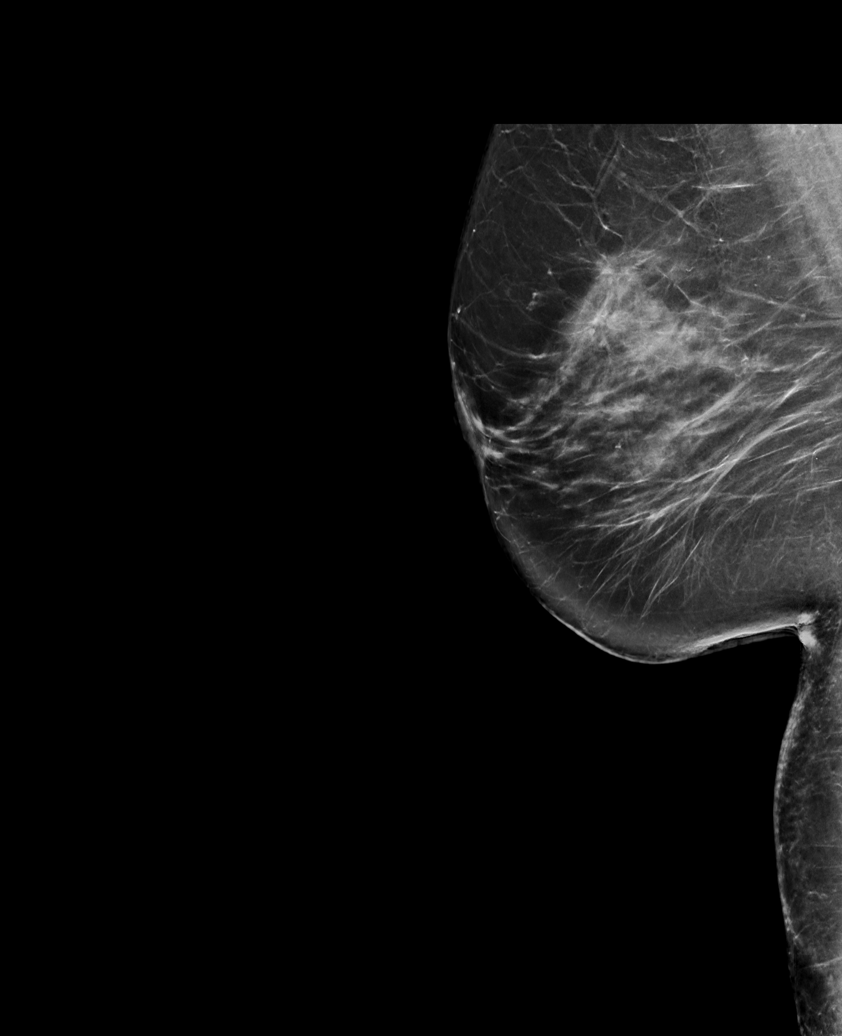

[L CC synth-2D]
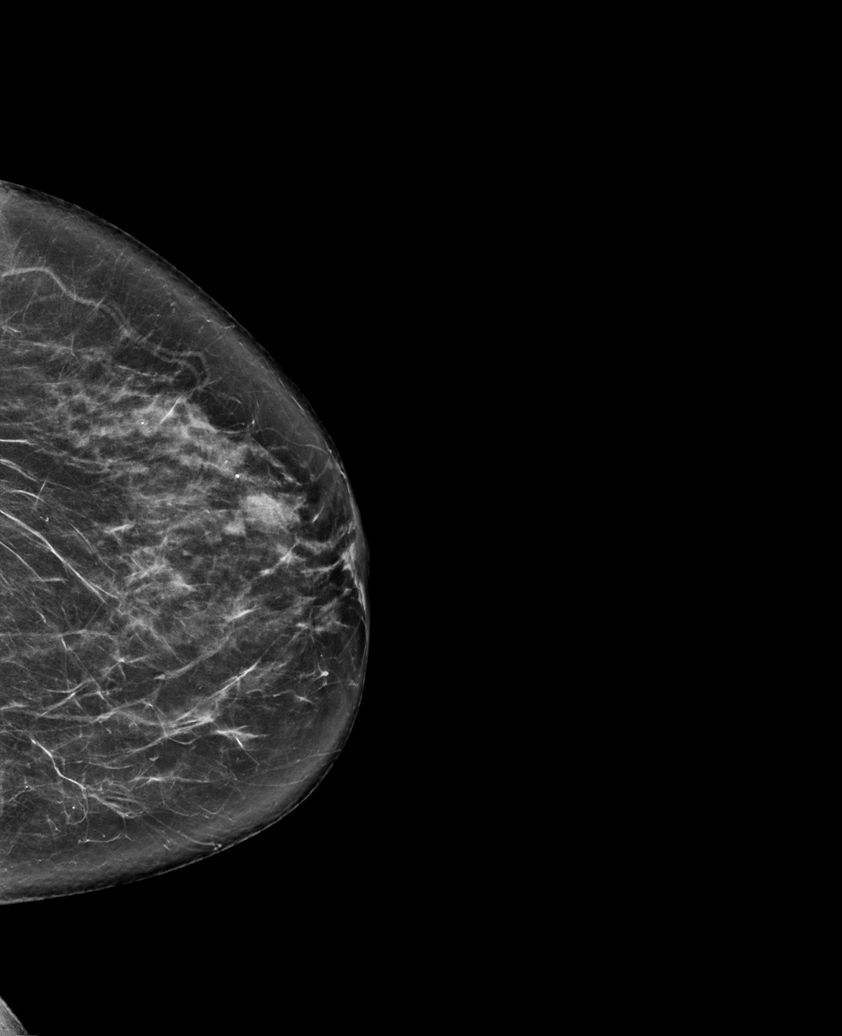

[L MLO tomo · tomo slice 43/85.0]
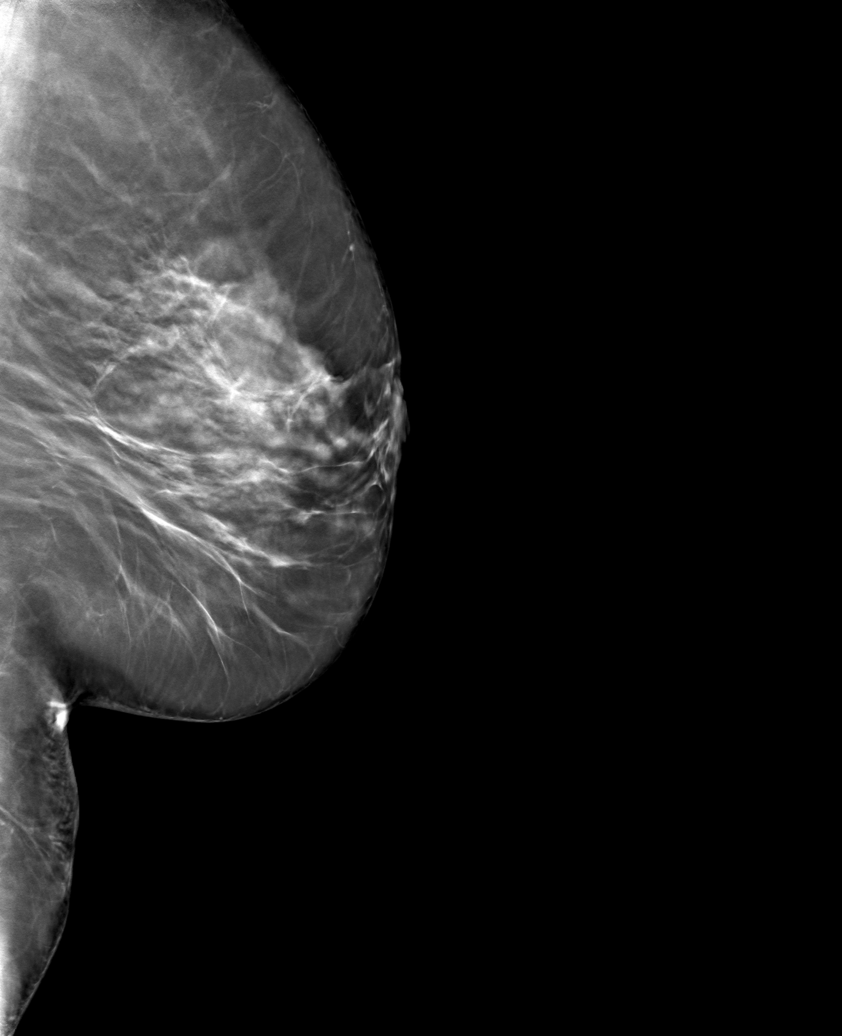

[6 of 30 positions shown; findings below may reference images not displayed]

ACR Breast Density Category c: The breast tissue is heterogeneously
dense, which may obscure small masses.
FINDINGS: There are no findings suspicious for malignancy.
IMPRESSION: No mammographic evidence of malignancy. A result letter of this
screening mammogram will be mailed directly to the patient.

RECOMMENDATION:
Screening mammogram in one year. (Code:[id number])

BI-RADS CATEGORY  1: Negative.

## 2022-11-27 ENCOUNTER — Encounter (HOSPITAL_BASED_OUTPATIENT_CLINIC_OR_DEPARTMENT_OTHER): Payer: Self-pay | Admitting: Emergency Medicine

## 2022-11-27 ENCOUNTER — Emergency Department (HOSPITAL_BASED_OUTPATIENT_CLINIC_OR_DEPARTMENT_OTHER)
Admission: EM | Admit: 2022-11-27 | Discharge: 2022-11-27 | Disposition: A | Discharge status: Home / Self Care | Payer: Managed Care, Other (non HMO) | Attending: Emergency Medicine | Admitting: Emergency Medicine

## 2022-11-27 ENCOUNTER — Other Ambulatory Visit (HOSPITAL_BASED_OUTPATIENT_CLINIC_OR_DEPARTMENT_OTHER): Payer: Self-pay

## 2022-11-27 ENCOUNTER — Other Ambulatory Visit: Payer: Self-pay

## 2022-11-27 DIAGNOSIS — Z20822 Contact with and (suspected) exposure to covid-19: Secondary | ICD-10-CM | POA: Insufficient documentation

## 2022-11-27 DIAGNOSIS — R682 Dry mouth, unspecified: Secondary | ICD-10-CM | POA: Insufficient documentation

## 2022-11-27 DIAGNOSIS — R197 Diarrhea, unspecified: Secondary | ICD-10-CM | POA: Insufficient documentation

## 2022-11-27 LAB — CBC
HCT: 38.4 % (ref 36.0–46.0)
Hemoglobin: 12.9 g/dL (ref 12.0–15.0)
MCH: 30.7 pg (ref 26.0–34.0)
MCHC: 33.6 g/dL (ref 30.0–36.0)
MCV: 91.4 fL (ref 80.0–100.0)
Platelets: 272 10*3/uL (ref 150–400)
RBC: 4.2 MIL/uL (ref 3.87–5.11)
RDW: 13 % (ref 11.5–15.5)
WBC: 6.2 10*3/uL (ref 4.0–10.5)
nRBC: 0 % (ref 0.0–0.2)

## 2022-11-27 LAB — COMPREHENSIVE METABOLIC PANEL
ALT: 8 U/L (ref 0–44)
AST: 15 U/L (ref 15–41)
Albumin: 4.3 g/dL (ref 3.5–5.0)
Alkaline Phosphatase: 65 U/L (ref 38–126)
Anion gap: 9 (ref 5–15)
BUN: 9 mg/dL (ref 8–23)
CO2: 23 mmol/L (ref 22–32)
Calcium: 9 mg/dL (ref 8.9–10.3)
Chloride: 108 mmol/L (ref 98–111)
Creatinine, Ser: 0.71 mg/dL (ref 0.44–1.00)
GFR, Estimated: >60 mL/min (ref >60)
Glucose, Bld: 98 mg/dL (ref 70–99)
Potassium: 4.1 mmol/L (ref 3.5–5.1)
Sodium: 140 mmol/L (ref 135–145)
Total Bilirubin: 0.7 mg/dL (ref 0.3–1.2)
Total Protein: 7.1 g/dL (ref 6.5–8.1)

## 2022-11-27 LAB — RESP PANEL BY RT-PCR (RSV, FLU A&B, COVID)  RVPGX2
Influenza A by PCR: NEGATIVE
Influenza B by PCR: NEGATIVE
Resp Syncytial Virus by PCR: NEGATIVE
SARS Coronavirus 2 by RT PCR: NEGATIVE

## 2022-11-27 LAB — LIPASE, BLOOD: Lipase: 15 U/L (ref 11–51)

## 2022-11-27 MED ORDER — ONDANSETRON 4 MG PO TBDP
4.0000 mg | ORAL_TABLET | Freq: Three times a day (TID) | ORAL | 0 refills | Status: DC | PRN
  Filled 2022-11-27: qty 18, 21d supply, fill #0

## 2022-11-27 MED ORDER — ONDANSETRON 4 MG PO TBDP
8.0000 mg | ORAL_TABLET | Freq: Once | ORAL | Status: AC
  Administered 2022-11-27: 8 mg via ORAL
  Filled 2022-11-27: qty 2

## 2022-11-27 NOTE — ED Provider Notes (Signed)
[place] EM[facility]GENCY DEPARTMENT AT [facility] Provider Note   CSN: [id number] Arrival date & time: 11/27/22  1301     History  Chief Complaint  Patient presents with   Diarrhea    Dana Li is a 62 y.o. female.   Diarrhea  Patient is a 62 year old female with past medical history significant for constipation, allergies, vertigo, menopause  Presents emergency room today with complaints of 2 episodes a day of watery stool for the past 4 days.  No recent antibiotics, no history of C. difficile, she describes achy crampy abdominal pain which is now resolved.  No fevers or chills.  No nausea or vomiting today but did have some nausea 3 days ago and vomited twice.  It was nonbloody nonbilious emesis.  No fevers or chills.  No urinary frequency urgency dysuria hematuria.  No chest pain     Home Medications Prior to Admission medications   Medication Sig Start Date End Date Taking? Authorizing Provider  ondansetron (ZOFRAN-ODT) 4 MG disintegrating tablet Take 1 tablet (4 mg total) by mouth every 8 (eight) hours as needed for nausea or vomiting. 11/27/22  Yes Solon Augusta S, PA  conjugated estrogens (PREMARIN) vaginal cream Use pea size amount 2-3 days per week 09/05/17   Tysinger, Kermit Balo, PA-C  diphenhydrAMINE (BENADRYL) 25 MG tablet Take 25 mg by mouth every 6 (six) hours as needed for itching. Sleep    [provider]  meclizine (ANTIVERT) 25 MG tablet Take 1 tablet (25 mg total) by mouth 3 (three) times daily as needed. Patient not taking: Reported on 09/05/2017 04/18/16   Tysinger, Kermit Balo, PA-C  Multiple Vitamins-Minerals (MULTIVITAMIN WITH MINERALS) tablet Take 1 tablet by mouth daily.    [provider]  Vitamin D, Ergocalciferol, (DRISDOL) 1.25 MG (50000 UT) CAPS capsule TAKE 1 CAPSULE (50,000 UNITS TOTAL) BY MOUTH EVERY 7 (SEVEN) DAYS. 10/20/18   Tysinger, Kermit Balo, PA-C      Allergies    Patient has no known allergies.    Review of Systems    Review of Systems  Gastrointestinal:  Positive for diarrhea.    Physical Exam Updated Vital Signs BP (!) 149/80   Pulse (!) 59   Temp 98.6 F (37 C) (Oral)   Resp 18   SpO2 97%  Physical Exam Vitals and nursing note reviewed.  Constitutional:      General: She is not in acute distress. HENT:     Head: Normocephalic and atraumatic.     Nose: Nose normal.     Mouth/Throat:     Mouth: Mucous membranes are dry.  Eyes:     General: No scleral icterus. Cardiovascular:     Rate and Rhythm: Normal rate and regular rhythm.     Pulses: Normal pulses.     Heart sounds: Normal heart sounds.  Pulmonary:     Effort: Pulmonary effort is normal. No respiratory distress.     Breath sounds: No wheezing.  Abdominal:     Palpations: Abdomen is soft.     Tenderness: There is no abdominal tenderness. There is no guarding or rebound.  Musculoskeletal:     Cervical back: Normal range of motion.     Right lower leg: No edema.     Left lower leg: No edema.  Skin:    General: Skin is warm and dry.     Capillary Refill: Capillary refill takes less than 2 seconds.  Neurological:     Mental Status: She is alert. Mental status  is at baseline.  Psychiatric:        Mood and Affect: Mood normal.        Behavior: Behavior normal.     ED Results / Procedures / Treatments   Labs (all labs ordered are listed, but only abnormal results are displayed) Labs Reviewed  RESP PANEL BY RT-PCR (RSV, FLU A&B, COVID)  [id number]  LIPASE, BLOOD  COMPREHENSIVE METABOLIC PANEL  CBC    EKG None  Radiology No results found.  Procedures Procedures    Medications Ordered in ED Medications  ondansetron (ZOFRAN-ODT) disintegrating tablet 8 mg (8 mg Oral Given 11/27/22 1453)    ED Course/ Medical Decision Making/ A&P                                  Medical Decision Making Amount and/or Complexity of Data Reviewed Labs: ordered.  Risk Prescription drug management.   Patient is a 62 year old  female with past medical history significant for constipation, allergies, vertigo, menopause  Presents emergency room today with complaints of 2 episodes a day of watery stool for the past 4 days.  No recent antibiotics, no history of C. difficile, she describes achy crampy abdominal pain which is now resolved.  No fevers or chills.  No nausea or vomiting today but did have some nausea 3 days ago and vomited twice.  It was nonbloody nonbilious emesis.  No fevers or chills.  No urinary frequency urgency dysuria hematuria.  No chest pain  Physical exam unremarkable apart from very slightly dry oral mucosa.  Offered IV hydration versus p.o. hydration with Zofran.  She would prefer the latter.  Patient given p.o. hydration, is tolerating p.o., feels better after Zofran.  Ambulated without any lightheadedness or presyncopal symptoms.  Respiratory panel negative lipase CBC CMP unremarkable will discharge home at this time.  Return precautions provided.  Discharged home with Zofran  Final Clinical Impression(s) / ED Diagnoses Final diagnoses:  Diarrhea, unspecified type    Rx / DC Orders ED Discharge Orders          Ordered    ondansetron (ZOFRAN-ODT) 4 MG disintegrating tablet  Every 8 hours PRN        11/27/22 1525              Gailen Shelter, [place] 11/27/22 1648    Gwyneth Sprout, MD 11/28/22 1340

## 2022-11-27 NOTE — ED Notes (Signed)
Pt nausea resolved, tolerating fluids at this time, ambulated in hallway without assistance, denies dizziness or SOB

## 2022-11-27 NOTE — ED Notes (Signed)
 RN reviewed discharge instructions with pt. Pt verbalized understanding and had no further questions. VSS upon discharge.  

## 2022-11-27 NOTE — ED Triage Notes (Signed)
Feeling light head/ diarrhea after eating [employer] saturday

## 2022-11-27 NOTE — Discharge Instructions (Signed)
Zofran as needed for nausea.  Hydrate. Follow food directions printed for you.  Follow up with primary care.

## 2023-04-29 ENCOUNTER — Other Ambulatory Visit: Payer: Self-pay | Admitting: Family Medicine

## 2023-04-29 DIAGNOSIS — Z1231 Encounter for screening mammogram for malignant neoplasm of breast: Secondary | ICD-10-CM

## 2023-04-30 ENCOUNTER — Ambulatory Visit
Admission: RE | Admit: 2023-04-30 | Discharge: 2023-04-30 | Discharge status: Home / Self Care | Payer: Medicaid Other | Source: Ambulatory Visit | Attending: Family Medicine | Admitting: Family Medicine

## 2023-04-30 DIAGNOSIS — Z1231 Encounter for screening mammogram for malignant neoplasm of breast: Secondary | ICD-10-CM

## 2023-05-03 ENCOUNTER — Other Ambulatory Visit: Payer: Self-pay | Admitting: Family Medicine

## 2023-05-03 DIAGNOSIS — R928 Other abnormal and inconclusive findings on diagnostic imaging of breast: Secondary | ICD-10-CM

## 2023-05-28 ENCOUNTER — Ambulatory Visit
Admission: RE | Admit: 2023-05-28 | Discharge: 2023-05-28 | Disposition: A | Discharge status: Home / Self Care | Payer: Medicaid Other | Source: Ambulatory Visit | Attending: Family Medicine | Admitting: Family Medicine

## 2023-05-28 ENCOUNTER — Ambulatory Visit: Payer: Medicaid Other

## 2023-05-28 DIAGNOSIS — R928 Other abnormal and inconclusive findings on diagnostic imaging of breast: Secondary | ICD-10-CM

## 2024-03-09 ENCOUNTER — Ambulatory Visit: Admission: EM | Admit: 2024-03-09 | Discharge: 2024-03-09 | Disposition: A | Discharge status: Home / Self Care

## 2024-03-09 ENCOUNTER — Other Ambulatory Visit: Payer: Self-pay

## 2024-03-09 ENCOUNTER — Encounter: Payer: Self-pay | Admitting: Emergency Medicine

## 2024-03-09 DIAGNOSIS — J069 Acute upper respiratory infection, unspecified: Secondary | ICD-10-CM | POA: Diagnosis not present

## 2024-03-09 DIAGNOSIS — R051 Acute cough: Secondary | ICD-10-CM

## 2024-03-09 LAB — POC COVID19/FLU A&B COMBO
Covid Antigen, POC: NEGATIVE
Influenza A Antigen, POC: NEGATIVE
Influenza B Antigen, POC: NEGATIVE

## 2024-03-09 LAB — POCT RESPIRATORY SYNCYTIAL VIRUS: RSV Rapid Ag: NEGATIVE

## 2024-03-09 MED ORDER — CETIRIZINE-PSEUDOEPHEDRINE ER 5-120 MG PO TB12: 1.0000 | ORAL_TABLET | Freq: Every day | ORAL | 0 refills | Status: AC

## 2024-03-09 MED ORDER — FLUTICASONE PROPIONATE 50 MCG/ACT NA SUSP: 2.0000 | Freq: Every day | NASAL | 0 refills | Status: AC

## 2024-03-09 MED ORDER — PROMETHAZINE-DM 6.25-15 MG/5ML PO SYRP: 10.0000 mL | ORAL_SOLUTION | Freq: Four times a day (QID) | ORAL | 0 refills | Status: AC | PRN

## 2024-03-09 NOTE — Discharge Instructions (Addendum)
 Your RSV, COVID, and flu tests were all negative today. Your symptoms are most likely due to a respiratory infection affecting your nose, throat, or lungs. These infections are usually caused by a virus, which means antibiotics will not help since they only treat bacterial infections.   Please take the medications prescribed to you as directed. For fever, body aches, or throat pain, you may use acetaminophen (Tylenol) or ibuprofen (Advil, Motrin) as needed. For nasal congestion, saline nasal spray or saline rinses can also be used freely to help clear mucus. Sore throat discomfort may improve with throat lozenges, warm saltwater gargles, or menthol/breathing strips.  These treatments do not cure the illness but will help you feel more comfortable while your body heals. Staying hydrated is very important--drink plenty of fluids and aim for pale yellow urine. Using a cool mist humidifier, sitting in steam from a warm shower, and elevating your head when sleeping can also help with congestion and drainage. Be sure to replace your toothbrush once you start feeling better.  A cough can linger for several weeks after a respiratory infection even as other symptoms improve. This is normal as long as the cough is slowly getting better. However, if your symptoms worsen, you have trouble breathing, chest pain, new high fever, coughing up blood, or you are unable to keep fluids down, please go to the emergency room or seek medical care right away. Follow up with your primary care provider if your symptoms are not improving within one week.

## 2024-03-09 NOTE — ED Triage Notes (Signed)
 Patient has watery eyes, runny nose, coughing up phlegm.  Right eye is particularly watery, and sometimes feels like something is in this eye.  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    Has used mucinex.    Symptoms started Friday.

## 2024-03-09 NOTE — ED Provider Notes (Signed)
 " [facility]    CSN: [id number] Arrival date & time: 03/09/24  0808      History   Chief Complaint Chief Complaint  Patient presents with   Cough    HPI Dana Li is a 63 y.o. female.   Discussed the use of AI scribe software for clinical note transcription with the patient, who gave verbal consent to proceed.   The patient presents with cough, runny nose, and watery eyes that have persisted for the past 3 days. The patient reports that the right eye feels more watery than the left.   Associated symptoms include nasal congestion, sneezing, and intermittent sore throat, though the patient reports no sore throat today. The patient describes feeling drainage going down the throat. The patient denies fever, shortness of breath, wheezing, nausea, vomiting, diarrhea, headaches, or body aches.  The patient has been taking Mucinex for symptom management. The patient denies exposure to other sick individuals besides her granddaughter who was recently diagnosed with RSV. The patient denies smoking or vaping.  The following sections of the patient's history were reviewed and updated as appropriate: allergies, current medications, past family history, past medical history, past social history, past surgical history, and problem list.     Past Medical History:  Diagnosis Date   Allergic rhinitis    Atrophic vaginitis    Constipation    H/O mammogram 2007   Menopause    Seasonal allergies    Vertigo, peripheral    occasional   Vitamin D  deficiency    Wears eyeglasses     Patient Active Problem List   Diagnosis Date Noted   Post-menopausal 09/05/2017   Estrogen deficiency 09/05/2017   Screening for heart disease 09/05/2017   Vaccine counseling 09/05/2017   Skin lesion 09/05/2017   Routine general medical examination at a health care facility 04/24/2016   Screening for breast cancer 04/24/2016   Screening for cervical cancer 04/24/2016   Screen for colon cancer  04/24/2016   Need for influenza vaccination 04/24/2016   Onychomycosis 04/24/2016   Tinea pedis of both feet 04/24/2016   Elevated blood-pressure reading without diagnosis of hypertension 04/18/2016   Vertigo, peripheral 01/14/2014   Menopausal state 01/14/2014   Atrophic vaginitis 01/14/2014   Vitamin D  deficiency 01/14/2014    Past Surgical History:  Procedure Laterality Date   APPENDECTOMY     childhood   BREAST CYST EXCISION Left 1981   CESAREAN SECTION     x2   COLONOSCOPY  06/2016   4/19 tubular adenoma polyp, Dr. Teressa, prior 2007   EXCISIONAL HEMORRHOIDECTOMY      OB History   No obstetric history on file.      Home Medications    Prior to Admission medications  Medication Sig Start Date End Date Taking? Authorizing Provider  amlodipine-olmesartan (AZOR) 10-20 MG tablet Take 1 tablet by mouth at bedtime. 02/14/24  Yes [provider]  cetirizine -pseudoephedrine  (ZYRTEC -D) 5-120 MG tablet Take 1 tablet by mouth daily with breakfast for 10 days. 03/09/24 03/19/24 Yes Iola Lukes, FNP  fluticasone  (FLONASE ) 50 MCG/ACT nasal spray Place 2 sprays into both nostrils daily. Shake well before use. Gently blow nose before spraying. Do not blow nose immediately after use. You should not taste the medication or feel it going down your throat; if you do, adjust your technique. 03/09/24  Yes Iola Lukes, FNP  promethazine -dextromethorphan (PROMETHAZINE -DM) 6.25-15 MG/5ML syrup Take 10 mLs by mouth every 6 (six) hours as needed for cough. 03/09/24  Yes  Iola Lukes, FNP  conjugated estrogens  (PREMARIN ) vaginal cream Use pea size amount 2-3 days per week 09/05/17   Tysinger, Alm RAMAN, PA-C  diphenhydrAMINE (BENADRYL) 25 MG tablet Take 25 mg by mouth every 6 (six) hours as needed for itching. Sleep    [provider]  meclizine  (ANTIVERT ) 25 MG tablet Take 1 tablet (25 mg total) by mouth 3 (three) times daily as needed. Patient not taking: Reported on  09/05/2017 04/18/16   Tysinger, Alm RAMAN, PA-C  Multiple Vitamins-Minerals (MULTIVITAMIN WITH MINERALS) tablet Take 1 tablet by mouth daily.    [provider]  Vitamin D , Ergocalciferol , (DRISDOL ) 1.25 MG (50000 UT) CAPS capsule TAKE 1 CAPSULE (50,000 UNITS TOTAL) BY MOUTH EVERY 7 (SEVEN) DAYS. 10/20/18   Tysinger, Alm RAMAN, PA-C    Family History Family History  Problem Relation Age of Onset   Breast cancer Mother 60   Diabetes Mother    Heart disease Mother 17       died of MI 2017-03-25   Other Father        died of illness   Colon cancer Paternal Uncle 75   Stroke Maternal Grandmother    Asthma Brother        1 died of asthma   Liver disease Brother    Stroke Brother    Esophageal cancer Neg Hx    Rectal cancer Neg Hx    Stomach cancer Neg Hx     Social History Social History[1]   Allergies   Patient has no known allergies.   Review of Systems Review of Systems  Constitutional:  Negative for fever.  HENT:  Positive for congestion, postnasal drip, rhinorrhea, sneezing and sore throat.   Eyes:  Positive for discharge (watery, R>L).  Respiratory:  Positive for cough. Negative for shortness of breath and wheezing.   Gastrointestinal:  Negative for diarrhea, nausea and vomiting.  Musculoskeletal:  Negative for myalgias.  Neurological:  Negative for headaches.  All other systems reviewed and are negative.    Physical Exam Triage Vital Signs ED Triage Vitals  Encounter Vitals Group     BP 03/09/24 0842 111/71     Girls Systolic BP Percentile --      Girls Diastolic BP Percentile --      Boys Systolic BP Percentile --      Boys Diastolic BP Percentile --      Pulse Rate 03/09/24 0842 85     Resp 03/09/24 0842 16     Temp 03/09/24 0842 98.6 F (37 C)     Temp Source 03/09/24 0842 Oral     SpO2 03/09/24 0842 95 %     Weight --      Height --      Head Circumference --      Peak Flow --      Pain Score 03/09/24 0839 0     Pain Loc --      Pain Education --       Exclude from Growth Chart --    No data found.  Updated Vital Signs BP 111/71 (BP Location: Left Arm)   Pulse 85   Temp 98.6 F (37 C) (Oral)   Resp 16   SpO2 95%   Visual Acuity Right Eye Distance:   Left Eye Distance:   Bilateral Distance:    Right Eye Near:   Left Eye Near:    Bilateral Near:     Physical Exam Vitals reviewed.  Constitutional:  General: She is awake. She is not in acute distress.    Appearance: Normal appearance. She is well-developed. She is not ill-appearing, toxic-appearing or diaphoretic.  HENT:     Head: Normocephalic.     Right Ear: Hearing, tympanic membrane, ear canal and external ear normal. No drainage, swelling or tenderness. No middle ear effusion. Tympanic membrane is not erythematous.     Left Ear: Hearing, tympanic membrane, ear canal and external ear normal. No drainage, swelling or tenderness.  No middle ear effusion. Tympanic membrane is not erythematous.     Nose: Congestion present.     Mouth/Throat:     Lips: Pink.     Mouth: Mucous membranes are moist.     Pharynx: No pharyngeal swelling, oropharyngeal exudate, posterior oropharyngeal erythema or uvula swelling.     Tonsils: No tonsillar exudate or tonsillar abscesses.  Eyes:     General: Vision grossly intact.        Right eye: No discharge.        Left eye: No discharge.     Extraocular Movements: Extraocular movements intact.     Conjunctiva/sclera: Conjunctivae normal.  Cardiovascular:     Rate and Rhythm: Normal rate.     Heart sounds: Normal heart sounds.  Pulmonary:     Effort: Pulmonary effort is normal. No tachypnea or respiratory distress.     Breath sounds: Normal breath sounds and air entry.     Comments: Respirations even and unlabored  Musculoskeletal:        General: Normal range of motion.     Cervical back: Normal range of motion and neck supple.  Lymphadenopathy:     Cervical: No cervical adenopathy.  Skin:    General: Skin is warm and dry.   Neurological:     General: No focal deficit present.     Mental Status: She is alert and oriented to person, place, and time.  Psychiatric:        Behavior: Behavior is cooperative.      UC Treatments / Results  Labs (all labs ordered are listed, but only abnormal results are displayed) Labs Reviewed  POC COVID19/FLU A&B COMBO  POCT RESPIRATORY SYNCYTIAL VIRUS    EKG   Radiology No results found.  Procedures Procedures (including critical care time)  Medications Ordered in UC Medications - No data to display  Initial Impression / Assessment and Plan / UC Course  I have reviewed the triage vital signs and the nursing notes.  Pertinent labs & imaging results that were available during my care of the patient were reviewed by me and considered in my medical decision making (see chart for details).     The patient presents with symptoms consistent with a viral upper respiratory infection. Exam is reassuring and no evidence of bacterial infection or acute cardiopulmonary process is noted. Flu, COVID and RSV tests were negative. Supportive care is recommended. Patient was advised to follow up with primary care if symptoms do not improve within one week or if new concerns arise. Instructions were given to seek emergency care if symptoms worsen, including shortness of breath, chest pain, persistent high fever, inability to tolerate fluids, or confusion.  Today's evaluation has revealed no signs of a dangerous process. Discussed diagnosis with patient and/or guardian. Patient and/or guardian aware of their diagnosis, possible red flag symptoms to watch out for and need for close follow up. Patient and/or guardian understands verbal and written discharge instructions. Patient and/or guardian comfortable with plan and disposition.  Patient and/or guardian has a clear mental status at this time, good insight into illness (after discussion and teaching) and has clear judgment to make  decisions regarding their care  Documentation was completed with the aid of voice recognition software. Transcription may contain typographical errors.   Final Clinical Impressions(s) / UC Diagnoses   Final diagnoses:  Acute cough  Viral upper respiratory tract infection     Discharge Instructions      Your RSV, COVID, and flu tests were all negative today. Your symptoms are most likely due to a respiratory infection affecting your nose, throat, or lungs. These infections are usually caused by a virus, which means antibiotics will not help since they only treat bacterial infections.   Please take the medications prescribed to you as directed. For fever, body aches, or throat pain, you may use acetaminophen (Tylenol) or ibuprofen (Advil, Motrin) as needed. For nasal congestion, saline nasal spray or saline rinses can also be used freely to help clear mucus. Sore throat discomfort may improve with throat lozenges, warm saltwater gargles, or menthol/breathing strips.  These treatments do not cure the illness but will help you feel more comfortable while your body heals. Staying hydrated is very important--drink plenty of fluids and aim for pale yellow urine. Using a cool mist humidifier, sitting in steam from a warm shower, and elevating your head when sleeping can also help with congestion and drainage. Be sure to replace your toothbrush once you start feeling better.  A cough can linger for several weeks after a respiratory infection even as other symptoms improve. This is normal as long as the cough is slowly getting better. However, if your symptoms worsen, you have trouble breathing, chest pain, new high fever, coughing up blood, or you are unable to keep fluids down, please go to the emergency room or seek medical care right away. Follow up with your primary care provider if your symptoms are not improving within one week.      ED Prescriptions     Medication Sig Dispense Auth.  Provider   cetirizine -pseudoephedrine  (ZYRTEC -D) 5-120 MG tablet Take 1 tablet by mouth daily with breakfast for 10 days. 10 tablet Iola Lukes, FNP   promethazine -dextromethorphan (PROMETHAZINE -DM) 6.25-15 MG/5ML syrup Take 10 mLs by mouth every 6 (six) hours as needed for cough. 118 mL Iola Lukes, FNP   fluticasone  (FLONASE ) 50 MCG/ACT nasal spray Place 2 sprays into both nostrils daily. Shake well before use. Gently blow nose before spraying. Do not blow nose immediately after use. You should not taste the medication or feel it going down your throat; if you do, adjust your technique. 16 g Iola Lukes, FNP      PDMP not reviewed this encounter.     [1]  Social History Tobacco Use   Smoking status: Former    Current packs/day: 0.00    Average packs/day: 0.5 packs/day for 18.0 years (9.0 ttl pk-yrs)    Types: Cigarettes    Start date: 03/19/1976    Quit date: 03/19/1994    Years since quitting: 29.9   Smokeless tobacco: Never  Vaping Use   Vaping status: Never Used  Substance Use Topics   Alcohol use: Yes    Alcohol/week: 2.0 standard drinks of alcohol    Types: 2 Glasses of wine per week    Comment: occasionally   Drug use: No     Iola Lukes, FNP 03/09/24 [phone]  "
